# Patient Record
Sex: Female | Born: 2000 | Hispanic: No | Marital: Single | Smoking: Never smoker
Health system: Southern US, Community
[De-identification: ages and names within clinical notes are randomized; demographics above are authoritative.]

## PROBLEM LIST (undated history)

## (undated) DIAGNOSIS — F4010 Social phobia, unspecified: Secondary | ICD-10-CM

## (undated) DIAGNOSIS — F419 Anxiety disorder, unspecified: Secondary | ICD-10-CM

## (undated) DIAGNOSIS — F32A Depression, unspecified: Secondary | ICD-10-CM

## (undated) DIAGNOSIS — F429 Obsessive-compulsive disorder, unspecified: Secondary | ICD-10-CM

## (undated) DIAGNOSIS — F329 Major depressive disorder, single episode, unspecified: Secondary | ICD-10-CM

## (undated) HISTORY — DX: Obsessive-compulsive disorder, unspecified: F42.9

## (undated) HISTORY — DX: Anxiety disorder, unspecified: F41.9

## (undated) HISTORY — DX: Depression, unspecified: F32.A

## (undated) HISTORY — DX: Social phobia, unspecified: F40.10

## (undated) HISTORY — DX: Major depressive disorder, single episode, unspecified: F32.9

---
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
## 2020-01-13 ENCOUNTER — Telehealth: Payer: No Typology Code available for payment source | Admitting: Emergency Medicine

## 2020-01-13 DIAGNOSIS — R112 Nausea with vomiting, unspecified: Secondary | ICD-10-CM

## 2020-01-13 NOTE — Progress Notes (Signed)
Based on what you shared with me, I feel your condition warrants further evaluation and I recommend that you be seen for a face to face office visit.   NOTE: If you entered your credit card information for this eVisit, you will not be charged. You may see a "hold" on your card for the $35 but that hold will drop off and you will not have a charge processed.   If you are having a true medical emergency please call 911.      For an urgent face to face visit, [facility] has five urgent care centers for your convenience:      NEW:  [facility] Get Driving Directions [phone] [address] [place], [place] [address] . 10 am - 6pm Monday - Friday    [facility] ([facility]) Get Driving Directions [phone] [address] [place], [place] [address] . 10 am to 8 pm Monday-Friday . 12 pm to 8 pm Saturday-Sunday     [facility] Get Driving Directions [phone] [address], [address] [place], [place] [address] . 8 am to 8 pm Monday-Friday . 9 am to 6 pm Saturday . 11 am to 6 pm Sunday     [facility] [place] Get Driving Directions  [phone] [address] [place], [place] [address] . 8 am to 8 pm Monday-Friday . 8 am to 4 pm Saturday-Sunday   [facility] Get Driving Directions [phone] [address] [place],  [address] . 12 pm to 6 pm Monday-Friday      Your e-visit answers were reviewed by a board certified advanced clinical practitioner to complete your personal care plan.  Thank you for using e-Visits.    Greater than 5 but less than 10 minutes spent researching, coordinating, and implementing care for this patient today  

## 2020-05-18 ENCOUNTER — Encounter (INDEPENDENT_AMBULATORY_CARE_PROVIDER_SITE_OTHER): Payer: Self-pay

## 2020-05-19 ENCOUNTER — Other Ambulatory Visit (INDEPENDENT_AMBULATORY_CARE_PROVIDER_SITE_OTHER): Payer: Self-pay | Admitting: *Deleted

## 2020-05-19 DIAGNOSIS — Z789 Other specified health status: Secondary | ICD-10-CM

## 2020-07-20 ENCOUNTER — Ambulatory Visit: Payer: 59 | Admitting: Endocrinology

## 2020-08-31 ENCOUNTER — Ambulatory Visit (INDEPENDENT_AMBULATORY_CARE_PROVIDER_SITE_OTHER): Payer: 59 | Admitting: Endocrinology

## 2020-08-31 ENCOUNTER — Other Ambulatory Visit: Payer: Self-pay

## 2020-08-31 VITALS — BP 122/80 | HR 100 | Ht 67.75 in | Wt 160.8 lb

## 2020-08-31 DIAGNOSIS — G43909 Migraine, unspecified, not intractable, without status migrainosus: Secondary | ICD-10-CM | POA: Insufficient documentation

## 2020-08-31 DIAGNOSIS — G43809 Other migraine, not intractable, without status migrainosus: Secondary | ICD-10-CM

## 2020-08-31 DIAGNOSIS — Z789 Other specified health status: Secondary | ICD-10-CM | POA: Diagnosis not present

## 2020-08-31 DIAGNOSIS — F419 Anxiety disorder, unspecified: Secondary | ICD-10-CM | POA: Diagnosis not present

## 2020-08-31 MED ORDER — TESTOSTERONE CYPIONATE 100 MG/ML IM SOLN: 100.0000 mg | INTRAMUSCULAR | 0 refills | Status: DC

## 2020-08-31 NOTE — Progress Notes (Signed)
Subjective:    Patient ID: Ann Gardner, adult    DOB: [birth year], 19 y.o.   MRN: [id number]  HPI Hx is mostly from pt's mother, as pt says very little.  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  He had puberty at the age of 22.  He had breast reduction in early 2021.  He has been going to counseling for this since 2011.  He has no children.  He has no h/o DVT, dyslipidemia, liver disease, kidney disease, sleep apnea, heart disease, CVA, diabetes, smoking, HTN, epilepsy, gallbladder disease, blood disorders, osteoporosis, or cancer.  He has been on testosterone injections since 2020, but he ran out 2 mos ago.  Menses have returned.  Pt says testosterone injections have caused increased terminal hair, but no other effect.  No change in chronic anxiety.  Past Medical History:  Diagnosis Date  . Anxiety   . Depression   . OCD (obsessive compulsive disorder)   . Social anxiety disorder     No past surgical history on file.  Social History   Socioeconomic History  . Marital status: Single    Spouse name: Not on file  . Number of children: Not on file  . Years of education: Not on file  . Highest education level: Not on file  Occupational History  . Not on file  Tobacco Use  . Smoking status: Passive Smoke Exposure - Never Smoker  . Smokeless tobacco: Never Used  . Tobacco comment: smokes outside only  Substance and Sexual Activity  . Alcohol use: Not on file  . Drug use: Not on file  . Sexual activity: Not on file  Other Topics Concern  . Not on file  Social History Narrative   Lives with mom, grandma, and step-dad.    He is a Engineer, agricultural.   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    He enjoys playing video games, listening to music, and sleeping.    Social Determinants of Health   Financial Resource Strain: Not on file  Food Insecurity: Not on file  Transportation Needs: Not on file  Physical Activity: Not on file  Stress: Not on file  Social  Connections: Not on file  Intimate Partner Violence: Not on file    Current Outpatient Medications on File Prior to Visit  Medication Sig Dispense Refill  . buPROPion (WELLBUTRIN XL) 150 MG 24 hr tablet Take 225 mg by mouth daily.     . Tuberculin-Allergy Syringes (B-D TB SYRINGE 1CC/22GX1") 22G X 1" 1 ML MISC Use with testosterone. 0.5 ml = 5 units 10 each 4  . amoxicillin-clavulanate (AUGMENTIN) 875-125 MG tablet amoxicillin 875 mg-potassium clavulanate 125 mg tablet  TAKE 1 TABLET BY MOUTH TWICE DAILY    . benzonatate (TESSALON PERLES) 100 MG capsule Take 1 capsule (100 mg total) by mouth 3 (three) times daily as needed. 20 capsule 0  . clindamycin-benzoyl peroxide (BENZACLIN) gel Apply topically 2 (two) times daily. (Patient not taking: Reported on 11/12/2019) 25 g 0  . dimenhyDRINATE (DRAMAMINE) 50 MG tablet Take 1 tablet (50 mg total) by mouth every 8 (eight) hours as needed. (Patient not taking: Reported on 11/12/2019) 30 tablet 0  . famotidine (PEPCID) 20 MG tablet Take 1 tablet (20 mg total) by mouth 2 (two) times daily. 60 tablet 2  . fluticasone (FLONASE) 50 MCG/ACT nasal spray Place 2 sprays into both nostrils daily. 16 g 6  . lansoprazole (PREVACID) 30 MG capsule Take 1  capsule (30 mg total) by mouth daily at 12 noon. (Patient not taking: Reported on 11/12/2019) 14 capsule 0  . ondansetron (ZOFRAN) 4 MG tablet Take 1 tablet (4 mg total) by mouth every 8 (eight) hours as needed for nausea or vomiting. 20 tablet 0   No current facility-administered medications on file prior to visit.    Allergies  Allergen Reactions  . Red Dye   . Sulfa Antibiotics     Family History  Problem Relation Age of Onset  . Osteoporosis Maternal Grandmother   . Hypertension Maternal Grandmother   . Lung cancer Maternal Grandfather   . Hypertension Maternal Grandfather     BP 122/80   Pulse 100   Ht 5' 7.75" (1.721 m)   Wt 160 lb 12.8 oz (72.9 kg)   SpO2 99%   BMI 24.63 kg/m   Review of  Systems Denies violent behavior, acne, weight change, sob, and nausea.       Objective:   Physical Exam VS: see vs page GEN: no distress HEAD: head: no deformity eyes: no periorbital swelling, no proptosis external nose and ears are normal NECK: supple, thyroid is not enlarged CHEST WALL: no deformity LUNGS: clear to auscultation BREASTS:  No breasts are present.  CV: reg rate and rhythm, no murmur GENITALIA:  Declined (but pt reports normal female appearance) MUSCULOSKELETAL: muscle bulk and strength are grossly normal.  no joint swelling is seen  gait is normal and steady EXTEMITIES: no leg edema NEURO:  cn 2-12 grossly intact.  sensation is intact to touch on all 4's. SKIN:  Mild acne on the back.  Moderate terminal hair on the extremities, but none on the face NODES:  None palpable at the neck PSYCH: alert, well-oriented.  Does not appear anxious nor depressed.     Testosterone=357     Assessment & Plan:  Transgender state, new to me Anxiety: check TFT with next labs  Patient Instructions  I have sent a prescription to your pharmacy, to resume the testosterone injections.   I would be happy to add another pill called "elagolix."  Just let me know.   Here is some information about the [facility] transgender service.  Please let me know if you would like a referral there Please do the blood tests in 2 months.  Please come back for a follow-up appointment in 6 months.

## 2020-08-31 NOTE — Patient Instructions (Signed)
I have sent a prescription to your pharmacy, to resume the testosterone injections.   I would be happy to add another pill called "elagolix."  Just let me know.   Here is some information about the [facility] transgender service.  Please let me know if you would like a referral there Please do the blood tests in 2 months.  Please come back for a follow-up appointment in 6 months.

## 2020-09-05 IMAGING — DX DG FOOT COMPLETE 3+V*R*
3 series · 3 of 3 positions shown · non-contrast
Comparison: 09/12/2016

CLINICAL DATA: Patient stepped on sewing needle 2 weeks ago.

EXAM:
RIGHT FOOT COMPLETE - 3+ VIEW

[foot ap]
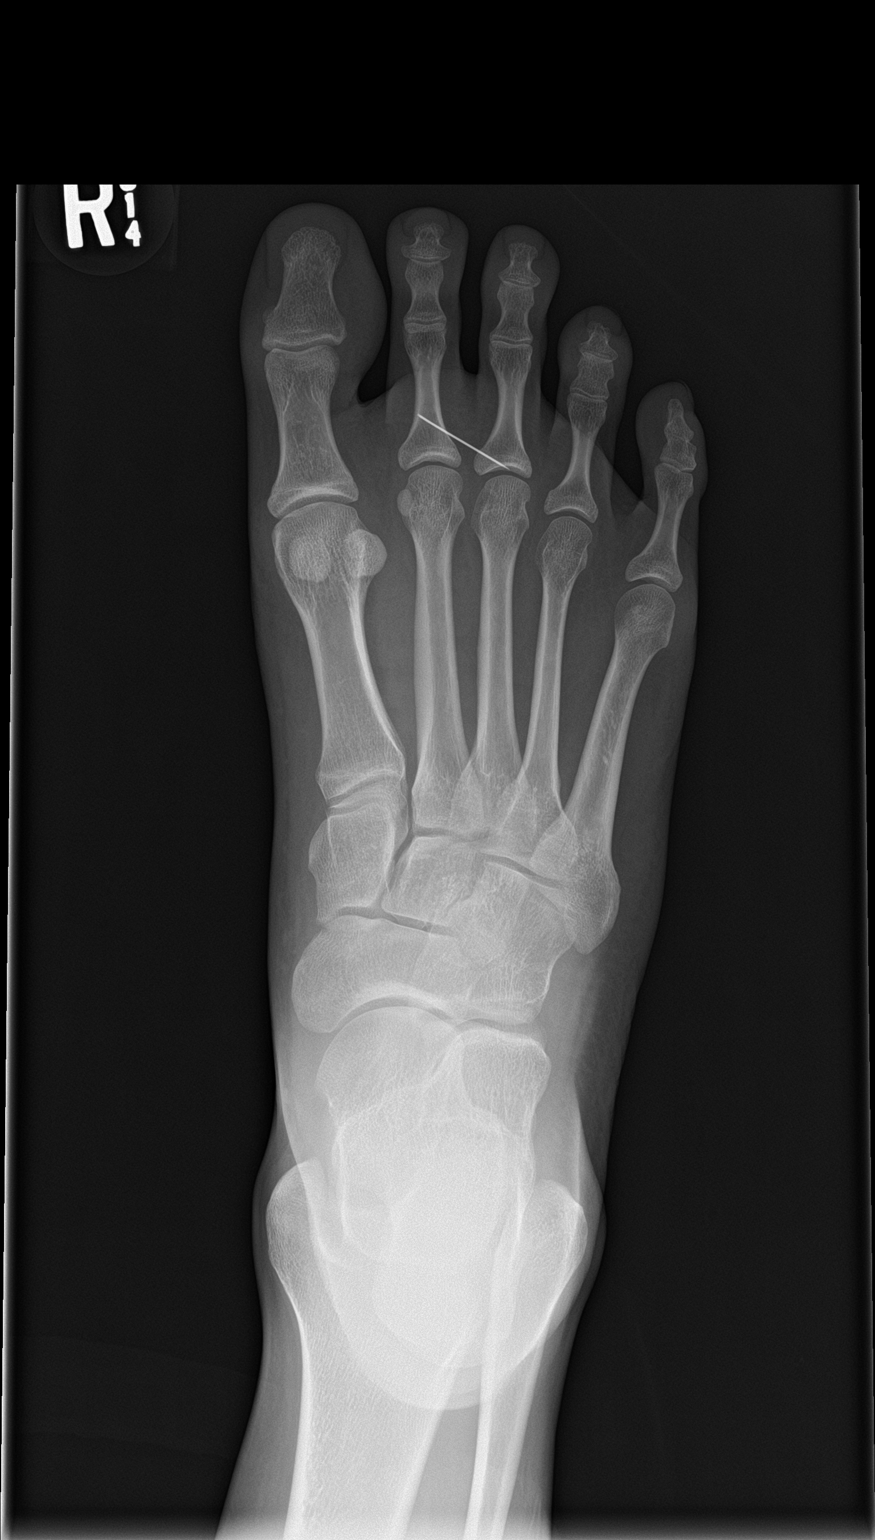

[foot obl]
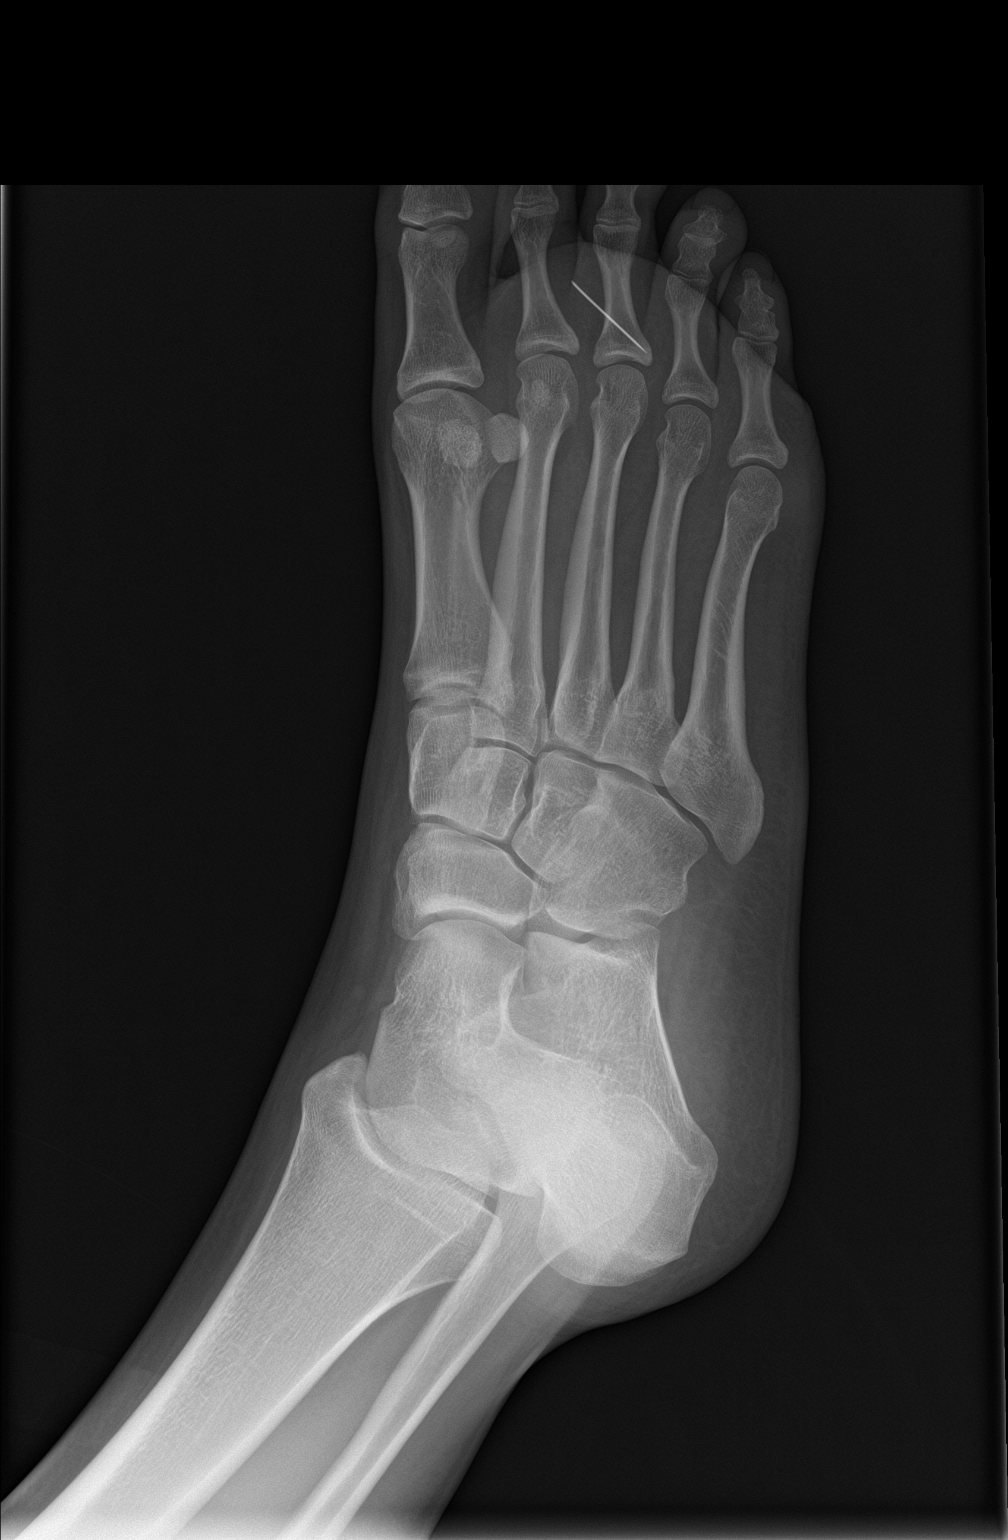

[foot lat]
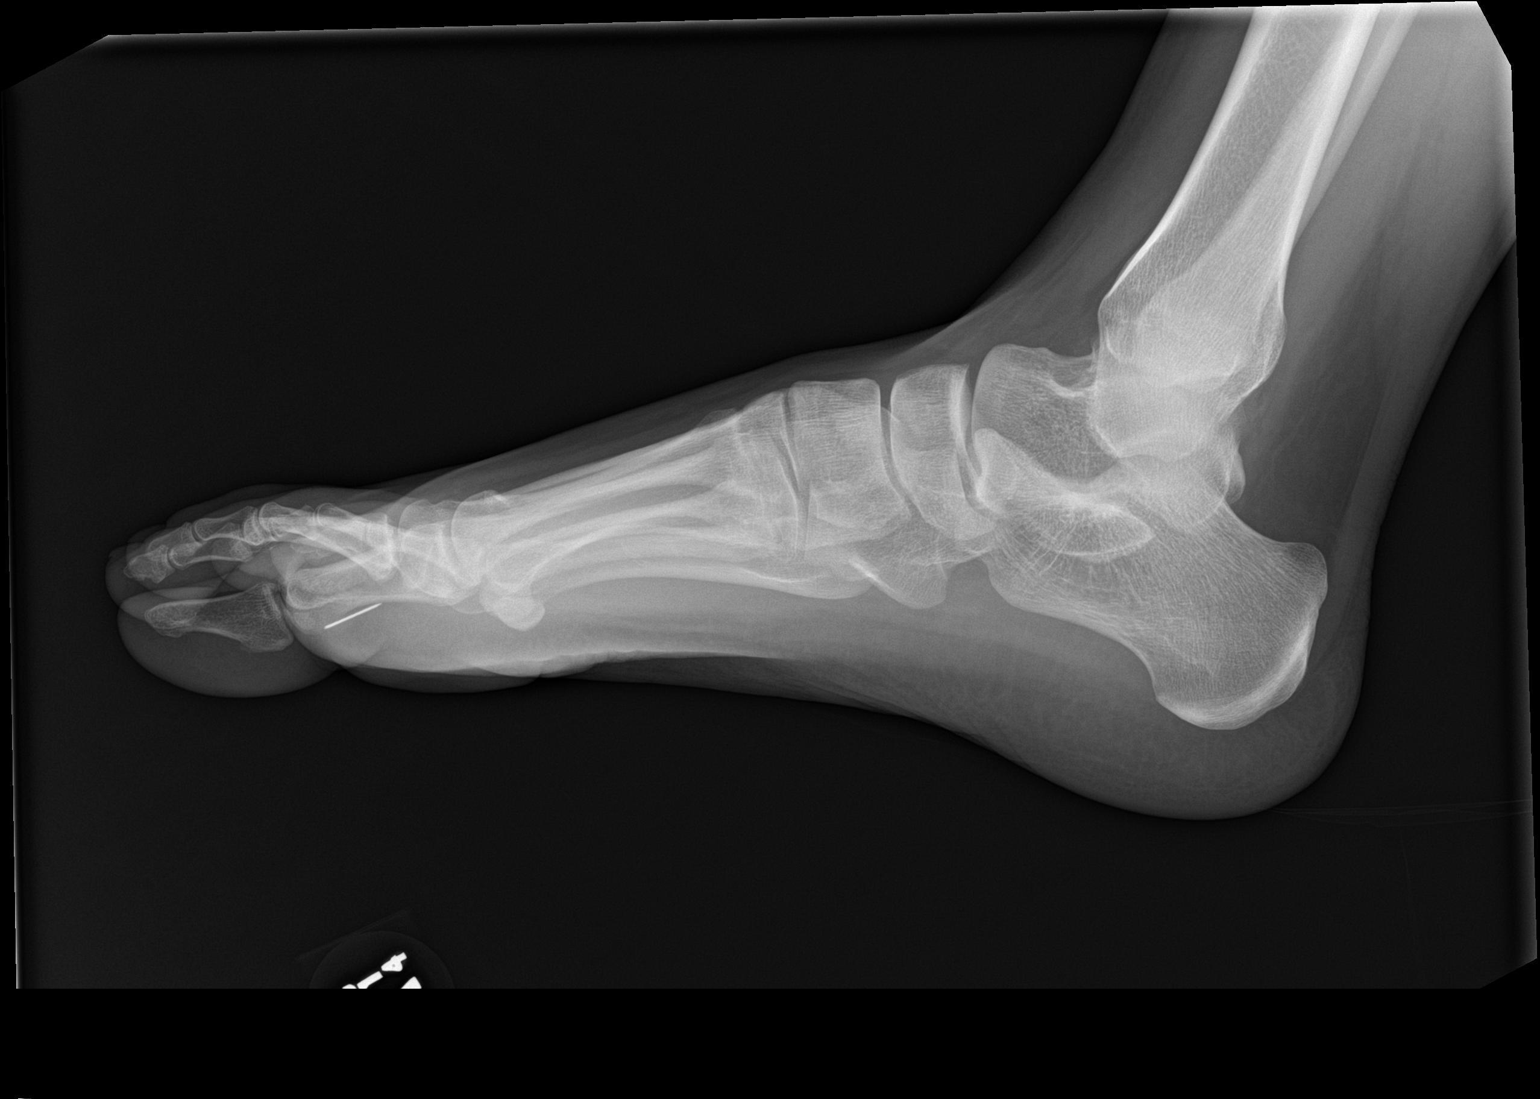

[3 of 3 positions shown; findings below may reference images not displayed]

FINDINGS: There is no evidence of fracture or dislocation. There is no
evidence of arthropathy or other focal bone abnormality. Linear
metallic foreign body, consistent with sewing needle is located
within the palmar soft tissues overlying the proximal second and
third digits, tip at the level of the third metatarsophalangeal
joint.
IMPRESSION: Linear metallic foreign body, consistent with sewing needle is
located within the palmar soft tissues overlying the proximal second
and third digits, tip at the level of the third metatarsophalangeal
joint.

## 2020-10-16 ENCOUNTER — Encounter: Payer: Self-pay | Admitting: Endocrinology

## 2020-10-22 ENCOUNTER — Encounter: Payer: Self-pay | Admitting: Endocrinology

## 2020-11-03 ENCOUNTER — Other Ambulatory Visit: Payer: 59

## 2020-11-23 ENCOUNTER — Encounter: Payer: Self-pay | Admitting: Endocrinology

## 2020-11-23 ENCOUNTER — Other Ambulatory Visit (INDEPENDENT_AMBULATORY_CARE_PROVIDER_SITE_OTHER): Payer: Self-pay

## 2020-11-23 ENCOUNTER — Other Ambulatory Visit: Payer: Self-pay

## 2020-11-23 DIAGNOSIS — Z789 Other specified health status: Secondary | ICD-10-CM

## 2020-11-23 LAB — CBC WITH DIFFERENTIAL/PLATELET
Basophils Absolute: 0.1 10*3/uL (ref 0.0–0.1)
Basophils Relative: 0.7 % (ref 0.0–3.0)
Eosinophils Absolute: 0.1 10*3/uL (ref 0.0–0.7)
Eosinophils Relative: 1.1 % (ref 0.0–5.0)
HCT: 43.7 % (ref 36.0–46.0)
Hemoglobin: 14.8 g/dL (ref 12.0–15.0)
Lymphocytes Relative: 20.4 % (ref 12.0–46.0)
Lymphs Abs: 1.7 10*3/uL (ref 0.7–4.0)
MCHC: 34 g/dL (ref 30.0–36.0)
MCV: 88.2 fl (ref 78.0–100.0)
Monocytes Absolute: 0.6 10*3/uL (ref 0.1–1.0)
Monocytes Relative: 7.1 % (ref 3.0–12.0)
Neutro Abs: 5.8 10*3/uL (ref 1.4–7.7)
Neutrophils Relative %: 70.7 % (ref 43.0–77.0)
Platelets: 183 10*3/uL (ref 150.0–400.0)
RBC: 4.95 Mil/uL (ref 3.87–5.11)
RDW: 13.7 % (ref 11.5–14.6)
WBC: 8.2 10*3/uL (ref 4.5–10.5)

## 2020-11-23 LAB — TSH: TSH: 1.51 u[IU]/mL (ref 0.35–5.50)

## 2020-11-23 LAB — T4, FREE: Free T4: 0.82 ng/dL (ref 0.60–1.60)

## 2020-11-24 LAB — TESTOSTERONE,FREE AND TOTAL
Testosterone, Free: 18.4 pg/mL — ABNORMAL HIGH (ref 0.0–4.2)
Testosterone: 783 ng/dL — ABNORMAL HIGH (ref 13–71)

## 2020-11-28 LAB — ESTRADIOL, FREE
Estradiol, Free: 3.97 pg/mL
Estradiol: 189 pg/mL

## 2020-12-15 ENCOUNTER — Encounter: Payer: Self-pay | Admitting: Endocrinology

## 2021-01-25 ENCOUNTER — Other Ambulatory Visit: Payer: Self-pay | Admitting: Endocrinology

## 2021-01-25 ENCOUNTER — Encounter: Payer: Self-pay | Admitting: Endocrinology

## 2021-01-25 MED ORDER — TESTOSTERONE CYPIONATE 100 MG/ML IM SOLN: 80.0000 mg | INTRAMUSCULAR | 0 refills | Status: DC

## 2021-02-16 ENCOUNTER — Telehealth: Payer: Self-pay | Admitting: Physician Assistant

## 2021-02-16 DIAGNOSIS — R140 Abdominal distension (gaseous): Secondary | ICD-10-CM

## 2021-02-16 NOTE — Progress Notes (Signed)
Based on what you shared with me, I feel your condition warrants further evaluation and I recommend that you be seen in a face to face office visit. Inability to pass gas with abdominal pain and bloating can be a sign of something more serious.  You need to have a physical exam and possibly imaging to determine the root of the problem.   NOTE: If you entered your credit card information for this eVisit, you will not be charged. You may see a "hold" on your card for the $35 but that hold will drop off and you will not have a charge processed.   If you are having a true medical emergency please call 911.      For an urgent face to face visit, [facility] has six urgent care centers for your convenience:     [facility] Directions [phone] [address] [place], [place] [address] . 8 am - 4 pm Monday - Friday    [facility]) Get Driving Directions [phone] [address] [place], [place] [address] . 8 am to 8 pm Monday-Friday . 10 am to 6 pm [facility]) Get Driving Directions [phone]  [address] [place],  [place]  [address] . 8 am to 8 pm Monday-Friday . 8 am to 4 pm [facility] at [facility] Get Driving Directions [phone] [address] [place], [place] [address] . 8 am to 8 pm Monday-Friday . 8 am to 4 pm [facility] at [facility] Get Driving Directions  [phone] [address] [place], [place] [address] . 8 am to 8 pm Monday-Friday . 8 am to 4 pm [facility] at [facility] Directions [phone] [address] [place], [place] [address] . 8 am to 8 pm Monday-Friday . 8 am to 4 pm Saturday-Sunday     Your MyChart E-visit  questionnaire answers were reviewed by a board certified advanced clinical practitioner to complete your personal care plan based on your specific symptoms.  Thank you for using e-Visits.    Greater than 5 minutes, yet less than 10 minutes of time have been spent researching, coordinating, and implementing care for this patient today

## 2021-02-25 ENCOUNTER — Ambulatory Visit: Payer: 59 | Admitting: Endocrinology

## 2021-03-16 ENCOUNTER — Telehealth: Payer: Self-pay | Admitting: Physician Assistant

## 2021-03-16 DIAGNOSIS — J069 Acute upper respiratory infection, unspecified: Secondary | ICD-10-CM

## 2021-03-17 MED ORDER — FLUTICASONE PROPIONATE 50 MCG/ACT NA SUSP: 2.0000 | Freq: Every day | NASAL | 6 refills | Status: DC

## 2021-03-17 MED ORDER — BENZONATATE 100 MG PO CAPS
100.0000 mg | ORAL_CAPSULE | Freq: Three times a day (TID) | ORAL | 0 refills | Status: DC | PRN
Start: 2021-03-17 — End: 2021-05-27

## 2021-03-17 NOTE — Progress Notes (Signed)
We are sorry you are not feeling well.  Here is how we plan to help!  Based on what you have shared with me, it looks like you may have a viral upper respiratory infection.  Upper respiratory infections are caused by a large number of viruses; however, rhinovirus is the most common cause.   Symptoms vary from person to person, with common symptoms including sore throat, cough, fatigue or lack of energy and feeling of general discomfort.  A low-grade fever of up to 100.4 may present, but is often uncommon.  Symptoms vary however, and are closely related to a person's age or underlying illnesses.  The most common symptoms associated with an upper respiratory infection are nasal discharge or congestion, cough, sneezing, headache and pressure in the ears and face.  These symptoms usually persist for about 3 to 10 days, but can last up to 2 weeks.  It is important to know that upper respiratory infections do not cause serious illness or complications in most cases.    Upper respiratory infections can be transmitted from person to person, with the most common method of transmission being a person's hands.  The virus is able to live on the skin and can infect other persons for up to 2 hours after direct contact.  Also, these can be transmitted when someone coughs or sneezes; thus, it is important to cover the mouth to reduce this risk.  To keep the spread of the illness at bay, good hand hygiene is very important.  This is an infection that is most likely caused by a virus. There are no specific treatments other than to help you with the symptoms until the infection runs its course.  We are sorry you are not feeling well.  Here is how we plan to help!   For nasal congestion, you may use an oral decongestants such as Mucinex D or if you have glaucoma or high blood pressure use plain Mucinex.  Saline nasal spray or nasal drops can help and can safely be used as often as needed for congestion.  For your congestion,  I have prescribed Fluticasone nasal spray one spray in each nostril twice a day  If you do not have a history of heart disease, hypertension, diabetes or thyroid disease, prostate/bladder issues or glaucoma, you may also use Sudafed to treat nasal congestion.  It is highly recommended that you consult with a pharmacist or your primary care physician to ensure this medication is safe for you to take.     If you have a cough, you may use cough suppressants such as Delsym and Robitussin.  If you have glaucoma or high blood pressure, you can also use Coricidin HBP.   For cough I have prescribed for you A prescription cough medication called Tessalon Perles 100 mg. You may take 1-2 capsules every 8 hours as needed for cough  If you have a sore or scratchy throat, use a saltwater gargle-  to  teaspoon of salt dissolved in a 4-ounce to 8-ounce glass of warm water.  Gargle the solution for approximately 15-30 seconds and then spit.  It is important not to swallow the solution.  You can also use throat lozenges/cough drops and Chloraseptic spray to help with throat pain or discomfort.  Warm or cold liquids can also be helpful in relieving throat pain.  For headache, pain or general discomfort, you can use Ibuprofen or Tylenol as directed.   Some authorities believe that zinc sprays or the use of   Echinacea may shorten the course of your symptoms.   HOME CARE . Only take medications as instructed by your medical team. . Be sure to drink plenty of fluids. Water is fine as well as fruit juices, sodas and electrolyte beverages. You may want to stay away from caffeine or alcohol. If you are nauseated, try taking small sips of liquids. How do you know if you are getting enough fluid? Your urine should be a pale yellow or almost colorless. . Get rest. . Taking a steamy shower or using a humidifier may help nasal congestion and ease sore throat pain. You can place a towel over your head and breathe in the steam  from hot water coming from a faucet. . Using a saline nasal spray works much the same way. . Cough drops, hard candies and sore throat lozenges may ease your cough. . Avoid close contacts especially the very young and the elderly . Cover your mouth if you cough or sneeze . Always remember to wash your hands.   GET HELP RIGHT AWAY IF: . You develop worsening fever. . If your symptoms do not improve within 10 days . You develop yellow or green discharge from your nose over 3 days. . You have coughing fits . You develop a severe head ache or visual changes. . You develop shortness of breath, difficulty breathing or start having chest pain . Your symptoms persist after you have completed your treatment plan  MAKE SURE YOU   Understand these instructions.  Will watch your condition.  Will get help right away if you are not doing well or get worse.  Your e-visit answers were reviewed by a board certified advanced clinical practitioner to complete your personal care plan. Depending upon the condition, your plan could have included both over the counter or prescription medications. Please review your pharmacy choice. If there is a problem, you may call our nursing hot line at and have the prescription routed to another pharmacy. Your safety is important to us. If you have drug allergies check your prescription carefully.   You can use MyChart to ask questions about today's visit, request a non-urgent call back, or ask for a work or school excuse for 24 hours related to this e-Visit. If it has been greater than 24 hours you will need to follow up with your provider, or enter a new e-Visit to address those concerns. You will get an e-mail in the next two days asking about your experience.  I hope that your e-visit has been valuable and will speed your recovery. Thank you for using e-visits.  I provided 5 minutes of non face-to-face time during this encounter for chart review and documentation.    

## 2021-05-27 ENCOUNTER — Other Ambulatory Visit: Payer: Self-pay

## 2021-05-27 ENCOUNTER — Ambulatory Visit (INDEPENDENT_AMBULATORY_CARE_PROVIDER_SITE_OTHER): Payer: Self-pay | Admitting: Endocrinology

## 2021-05-27 VITALS — BP 120/70 | HR 79 | Ht 67.5 in | Wt 181.2 lb

## 2021-05-27 DIAGNOSIS — Z789 Other specified health status: Secondary | ICD-10-CM

## 2021-05-27 LAB — CBC WITH DIFFERENTIAL/PLATELET
Basophils Absolute: 0 10*3/uL (ref 0.0–0.1)
Basophils Relative: 1 % (ref 0.0–3.0)
Eosinophils Absolute: 0.1 10*3/uL (ref 0.0–0.7)
Eosinophils Relative: 1.5 % (ref 0.0–5.0)
HCT: 45.8 % (ref 36.0–46.0)
Hemoglobin: 15.5 g/dL — ABNORMAL HIGH (ref 12.0–15.0)
Lymphocytes Relative: 32.5 % (ref 12.0–46.0)
Lymphs Abs: 1.7 10*3/uL (ref 0.7–4.0)
MCHC: 33.9 g/dL (ref 30.0–36.0)
MCV: 91 fl (ref 78.0–100.0)
Monocytes Absolute: 0.4 10*3/uL (ref 0.1–1.0)
Monocytes Relative: 7.6 % (ref 3.0–12.0)
Neutro Abs: 3 10*3/uL (ref 1.4–7.7)
Neutrophils Relative %: 57.4 % (ref 43.0–77.0)
Platelets: 161 10*3/uL (ref 150.0–400.0)
RBC: 5.04 Mil/uL (ref 3.87–5.11)
RDW: 14 % (ref 11.5–14.6)
WBC: 5.1 10*3/uL (ref 4.5–10.5)

## 2021-05-27 NOTE — Progress Notes (Signed)
   Subjective:    Patient ID: Ann Gardner, adult    DOB: 20-May-2001, 20 y.o.   MRN: [id number]  HPI Pt returns for f/u of transgender state (F to M): Surgery:  breast reduction in 2021 Medication: depo-testosterone since 2020 Counseling: since 2011 Other: Ins declines elagolix.  SDOH: Hx is mostly from pt's mother, as pt says very little. Interval hx: Last shot was 2 weeks ago.  He has no menses.   Past Medical History:  Diagnosis Date   Anxiety    Depression    OCD (obsessive compulsive disorder)    Social anxiety disorder     No past surgical history on file.  Social History   Socioeconomic History   Marital status: Single    Spouse name: Not on file   Number of children: Not on file   Years of education: Not on file   Highest education level: Not on file  Occupational History   Not on file  Tobacco Use   Smoking status: Passive Smoke Exposure - Never Smoker   Smokeless tobacco: Never   Tobacco comments:    smokes outside only  Substance and Sexual Activity   Alcohol use: Not on file   Drug use: Not on file   Sexual activity: Not on file  Other Topics Concern   Not on file  Social History Narrative   Lives with mom, grandma, and step-dad.    He is a Engineer, agricultural.   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    He enjoys playing video games, listening to music, and sleeping.    Social Determinants of Health   Financial Resource Strain: Not on file  Food Insecurity: Not on file  Transportation Needs: Not on file  Physical Activity: Not on file  Stress: Not on file  Social Connections: Not on file  Intimate Partner Violence: Not on file    Current Outpatient Medications on File Prior to Visit  Medication Sig Dispense Refill   buPROPion (WELLBUTRIN XL) 150 MG 24 hr tablet Take 225 mg by mouth daily.      fluticasone (FLONASE) 50 MCG/ACT nasal spray Place 2 sprays into both nostrils daily. 16 g 6   testosterone cypionate (DEPO-TESTOSTERONE) 100 MG/ML  injection Inject 0.8 mLs (80 mg total) into the muscle every 14 (fourteen) days. And syringes 1/week 10 mL 0   Tuberculin-Allergy Syringes (B-D TB SYRINGE 1CC/22GX1") 22G X 1" 1 ML MISC Use with testosterone. 0.5 ml = 5 units 10 each 4   No current facility-administered medications on file prior to visit.    Allergies  Allergen Reactions   Red Dye    Sulfa Antibiotics     Family History  Problem Relation Age of Onset   Osteoporosis Maternal Grandmother    Hypertension Maternal Grandmother    Lung cancer Maternal Grandfather    Hypertension Maternal Grandfather     BP 120/70 (BP Location: Right Arm, Patient Position: Sitting, Cuff Size: Normal)   Pulse 79   Ht 5' 7.5" (1.715 m)   Wt 181 lb 3.2 oz (82.2 kg)   SpO2 98%   BMI 27.96 kg/m    Review of Systems     Objective:   Physical Exam EXT: no leg edema  Lab Results  Component Value Date   TESTOSTERONE 368 (H) 05/27/2021      Assessment & Plan:  Transgender state: I advised him to change testosterone to weekly, but he declines

## 2021-05-27 NOTE — Patient Instructions (Addendum)
Blood tests are requested for you today.  We'll let you know about the results.  Please let me know if you would like a referral for surgery.   Please come back for a follow-up appointment in 6 months.

## 2021-06-02 LAB — TESTOSTERONE,FREE AND TOTAL
Testosterone, Free: 9.5 pg/mL — ABNORMAL HIGH (ref 0.0–4.2)
Testosterone: 368 ng/dL — ABNORMAL HIGH (ref 13–71)

## 2021-06-05 LAB — ESTRADIOL, FREE
Estradiol, Free: 0.75 pg/mL
Estradiol: 34 pg/mL

## 2021-06-06 ENCOUNTER — Encounter: Payer: Self-pay | Admitting: Endocrinology

## 2021-07-02 ENCOUNTER — Other Ambulatory Visit: Payer: Self-pay | Admitting: Endocrinology

## 2021-07-02 MED ORDER — TESTOSTERONE CYPIONATE 100 MG/ML IM SOLN: 80.0000 mg | INTRAMUSCULAR | 0 refills | Status: DC

## 2021-07-22 ENCOUNTER — Other Ambulatory Visit (HOSPITAL_COMMUNITY): Payer: Self-pay

## 2021-07-22 ENCOUNTER — Telehealth: Payer: Self-pay | Admitting: Pharmacy Technician

## 2021-07-22 NOTE — Telephone Encounter (Signed)
Patient Advocate Encounter   Received notification from CoverMyMeds that prior authorization for Testosterone is required.   PA submitted on 07/22/21 Key [id number] Status is pending    [facility] will continue to follow:   Sherilyn Dacosta, CPhT Patient Advocate [facility] Phone: [phone] Fax:  [phone]

## 2021-07-23 ENCOUNTER — Other Ambulatory Visit (HOSPITAL_COMMUNITY): Payer: Self-pay

## 2021-07-23 NOTE — Telephone Encounter (Signed)
[facility] Patient Advocate Encounter  Prior Authorization for Testosterone has been approved.    PA# PA Case ID: [id number]  Effective dates: 07/22/21 through 07/22/22  Sherilyn Dacosta, CPhT Patient Advocate [facility] Phone: [phone] Fax:  [phone]

## 2021-07-28 ENCOUNTER — Telehealth: Payer: Self-pay | Admitting: Physician Assistant

## 2021-07-28 DIAGNOSIS — J029 Acute pharyngitis, unspecified: Secondary | ICD-10-CM

## 2021-07-28 MED ORDER — LIDOCAINE VISCOUS HCL 2 % MT SOLN: OROMUCOSAL | 0 refills | Status: AC

## 2021-07-28 MED ORDER — FLUTICASONE PROPIONATE 50 MCG/ACT NA SUSP: 2.0000 | Freq: Every day | NASAL | 0 refills | Status: DC

## 2021-07-28 NOTE — Progress Notes (Signed)
E-Visit for Sore Throat  We are sorry that you are not feeling well.  Here is how we plan to help!  Your symptoms indicate a likely viral infection (Pharyngitis).   Pharyngitis is inflammation in the back of the throat which can cause a sore throat, scratchiness and sometimes difficulty swallowing.   Pharyngitis is typically caused by a respiratory virus and will just run its course.  Please keep in mind that your symptoms could last up to 10 days.  For throat pain, we recommend over the counter oral pain relief medications such as acetaminophen or aspirin, or anti-inflammatory medications such as ibuprofen or naproxen sodium.  Topical treatments such as oral throat lozenges or sprays may be used as needed.  Avoid close contact with loved ones, especially the very young and elderly.  Remember to wash your hands thoroughly throughout the day as this is the number one way to prevent the spread of infection and wipe down door knobs and counters with disinfectant.  I will prescribe fluticasone nasal spray for the nasal congestion and viscous lidocaine for the sore throat.   After careful review of your answers, I would not recommend and antibiotic for your condition.  Antibiotics should not be used to treat conditions that we suspect are caused by viruses like the virus that causes the common cold or flu. However, some people can have Strep with atypical symptoms. You may need formal testing in clinic or office to confirm if your symptoms continue or worsen.  Providers prescribe antibiotics to treat infections caused by bacteria. Antibiotics are very powerful in treating bacterial infections when they are used properly.  To maintain their effectiveness, they should be used only when necessary.  Overuse of antibiotics has resulted in the development of super bugs that are resistant to treatment!    Home Care: Only take medications as instructed by your medical team. Do not drink alcohol while taking these  medications. A steam or ultrasonic humidifier can help congestion.  You can place a towel over your head and breathe in the steam from hot water coming from a faucet. Avoid close contacts especially the very young and the elderly. Cover your mouth when you cough or sneeze. Always remember to wash your hands.  Get Help Right Away If: You develop worsening fever or throat pain. You develop a severe head ache or visual changes. Your symptoms persist after you have completed your treatment plan.  Make sure you Understand these instructions. Will watch your condition. Will get help right away if you are not doing well or get worse.   Thank you for choosing an e-visit.  Your e-visit answers were reviewed by a board certified advanced clinical practitioner to complete your personal care plan. Depending upon the condition, your plan could have included both over the counter or prescription medications.  Please review your pharmacy choice. Make sure the pharmacy is open so you can pick up prescription now. If there is a problem, you may contact your provider through [employer] and have the prescription routed to another pharmacy.  Your safety is important to [place]. If you have drug allergies check your prescription carefully.   For the next 24 hours you can use MyChart to ask questions about today's visit, request a non-urgent call back, or ask for a work or school excuse. You will get an email in the next two days asking about your experience. I hope that your e-visit has been valuable and will speed your recovery.  I  provided 5 minutes of non face-to-face time during this encounter for chart review and documentation.

## 2021-07-29 ENCOUNTER — Other Ambulatory Visit (HOSPITAL_COMMUNITY): Payer: Self-pay

## 2021-10-12 ENCOUNTER — Telehealth: Payer: Self-pay | Admitting: Family Medicine

## 2021-10-12 DIAGNOSIS — Z91199 Patient's noncompliance with other medical treatment and regimen due to unspecified reason: Secondary | ICD-10-CM

## 2021-10-12 NOTE — Progress Notes (Signed)
Never responded back to clarifying questions.  [facility] visit  Message sent

## 2021-10-15 ENCOUNTER — Telehealth: Payer: Self-pay | Admitting: Physician Assistant

## 2021-10-15 DIAGNOSIS — J069 Acute upper respiratory infection, unspecified: Secondary | ICD-10-CM

## 2021-10-15 MED ORDER — NAPROXEN 500 MG PO TABS: 500.0000 mg | ORAL_TABLET | Freq: Two times a day (BID) | ORAL | 0 refills | Status: DC

## 2021-10-15 MED ORDER — FLUTICASONE PROPIONATE 50 MCG/ACT NA SUSP: 2.0000 | Freq: Every day | NASAL | 0 refills | Status: DC

## 2021-10-15 MED ORDER — BENZONATATE 100 MG PO CAPS: 100.0000 mg | ORAL_CAPSULE | Freq: Three times a day (TID) | ORAL | 0 refills | Status: DC | PRN

## 2021-10-15 NOTE — Progress Notes (Signed)
E-Visit for Corona Virus Screening  Your current symptoms could be consistent with the coronavirus.  Many health care providers can now test patients at their office but not all are.  [facility] has multiple testing sites. For information on our COVID testing locations and hours go to [email or web address]  We are enrolling you in our MyChart Home Monitoring for COVID19 . Daily you will receive a questionnaire within the MyChart website. Our COVID 19 response team will be monitoring your responses daily.  Testing Information: The COVID-19 Community Testing sites are testing BY APPOINTMENT ONLY.  You can schedule online at [email or web address]  If you do not have access to a smart phone or computer you may call [phone] for an appointment.   Additional testing sites in the Community:  For [facility] Testing sites in [place]  https://www.[facility].com/[facility]/covid-19-testing  For [facility] testing sites in [place]  https://covid19.ncdhhs.gov/about-covid-19/testing/find-my-testing-place/pop-testing-sites  For [facility] https://[place].gov[place]/human-services/health-department/coronavirus-covid-19-info/covid-19-testing  For [place] testing in [place] https://www[place].gov[place]/human-services/health-department/coronavirus-covid-19-info/covid-19-testing  For [facility] testing in [place]   [email or web address].[email or web address]  For  more information about community testing call [phone]   Please quarantine yourself while awaiting your test results. Please stay home for a minimum of 10 days from the first day of illness with improving symptoms and you have had 24 hours of no fever (without the use of Tylenol (Acetaminophen) Motrin (Ibuprofen) or any fever reducing medication).  Also - Do not get tested prior to returning to work because once you have had a positive test the test can stay positive for  more than a month in some cases.   You should wear a mask or cloth face covering over your nose and mouth if you must be around other people or animals, including pets (even at home). Try to stay at least 6 feet away from other people. This will protect the people around you.  Please continue good preventive care measures, including:  frequent hand-washing, avoid touching your face, cover coughs/sneezes, stay out of crowds and keep a 6 foot distance from others.  COVID-19 is a respiratory illness with symptoms that are similar to the flu. Symptoms are typically mild to moderate, but there have been cases of severe illness and death due to the virus.   The following symptoms may appear 2-14 days after exposure: Fever Cough Shortness of breath or difficulty breathing Chills Repeated shaking with chills Muscle pain Headache Sore throat New loss of taste or smell Fatigue Congestion or runny nose Nausea or vomiting Diarrhea  Go to the nearest hospital ED for assessment if fever/cough/breathlessness are severe or illness seems like a threat to life.  It is vitally important that if you feel that you have an infection such as this virus or any other virus that you stay home and away from places where you may spread it to others.  You should avoid contact with people age 65 and older.   You can use medication such as prescription cough medication called Tessalon Perles 100 mg. You may take 1-2 capsules every 8 hours as needed for cough, prescription anti-inflammatory called Naprosyn 500 mg. Take twice daily as needed for fever or body aches for 2 weeks, and prescription for Fluticasone nasal spray 2 sprays in each nostril one time per day  You may also take acetaminophen (Tylenol) as needed for fever.  Reduce your risk of any infection by using the same precautions used for avoiding the common cold or flu:  Wash your hands often with soap and   warm water for at least 20 seconds.  If soap and water  are not readily available, use an alcohol-based hand sanitizer with at least 60% alcohol.  If coughing or sneezing, cover your mouth and nose by coughing or sneezing into the elbow areas of your shirt or coat, into a tissue or into your sleeve (not your hands). Avoid shaking hands with others and consider head nods or verbal greetings only. Avoid touching your eyes, nose, or mouth with unwashed hands.  Avoid close contact with people who are sick. Avoid places or events with large numbers of people in one location, like concerts or sporting events. Carefully consider travel plans you have or are making. If you are planning any travel outside or inside the [place], visit the CDC's Travelers' Health webpage for the latest health notices. If you have some symptoms but not all symptoms, continue to monitor at home and seek medical attention if your symptoms worsen. If you are having a medical emergency, call 911.  HOME CARE Only take medications as instructed by your medical team. Drink plenty of fluids and get plenty of rest. A steam or ultrasonic humidifier can help if you have congestion.   GET HELP RIGHT AWAY IF YOU HAVE EMERGENCY WARNING SIGNS** FOR COVID-19. If you or someone is showing any of these signs seek emergency medical care immediately. Call 911 or proceed to your closest emergency facility if: You develop worsening high fever. Trouble breathing Bluish lips or face Persistent pain or pressure in the chest New confusion Inability to wake or stay awake You cough up blood. Your symptoms become more severe  **This list is not all possible symptoms. Contact your medical provider for any symptoms that are sever or concerning to you.  MAKE SURE YOU  Understand these instructions. Will watch your condition. Will get help right away if you are not doing well or get worse.  Your e-visit answers were reviewed by a board certified advanced clinical practitioner to complete your personal  care plan.  Depending on the condition, your plan could have included both over the counter or prescription medications.  If there is a problem please reply once you have received a response from your provider.  Your safety is important to us.  If you have drug allergies check your prescription carefully.    You can use MyChart to ask questions about today's visit, request a non-urgent call back, or ask for a work or school excuse for 24 hours related to this e-Visit. If it has been greater than 24 hours you will need to follow up with your provider, or enter a new e-Visit to address those concerns. You will get an e-mail in the next two days asking about your experience.  I hope that your e-visit has been valuable and will speed your recovery. Thank you for using e-visits.  I provided 5 minutes of non face-to-face time during this encounter for chart review and documentation.   

## 2021-10-16 ENCOUNTER — Telehealth: Payer: Self-pay | Admitting: Nurse Practitioner

## 2021-10-16 DIAGNOSIS — J069 Acute upper respiratory infection, unspecified: Secondary | ICD-10-CM

## 2021-10-16 MED ORDER — LIDOCAINE VISCOUS HCL 2 % MT SOLN: 5.0000 mL | OROMUCOSAL | 0 refills | Status: AC | PRN

## 2021-10-16 NOTE — Progress Notes (Signed)
E-Visit for Sore Throat  We are sorry that you are not feeling well.  Here is how we plan to help!  Your symptoms indicate a likely viral infection (Pharyngitis).   Pharyngitis is inflammation in the back of the throat which can cause a sore throat, scratchiness and sometimes difficulty swallowing.   Pharyngitis is typically caused by a respiratory virus and will just run its course.  Please keep in mind that your symptoms could last up to 10 days.  For throat pain, we recommend over the counter oral pain relief medications such as acetaminophen or aspirin, or anti-inflammatory medications such as ibuprofen or naproxen sodium.  Topical treatments such as oral throat lozenges or sprays may be used as needed.  Avoid close contact with loved ones, especially the very young and elderly.  Remember to wash your hands thoroughly throughout the day as this is the number one way to prevent the spread of infection and wipe down door knobs and counters with disinfectant.  We can prescribe a mouth wash/gargle that can help decrease the pain in your throat.   Meds ordered this encounter  Medications   lidocaine (XYLOCAINE) 2 % solution    Sig: Use as directed 5 mLs in the mouth or throat every 4 (four) hours as needed for up to 5 days for mouth pain (sore throat). Swish, gargle and spit solution up to every 4 hours as needed for sore throat.    Dispense:  150 mL    Refill:  0      Also given your age, if you have not had mono in the past, you should consider getting tested for Mono. This can cause an ongoing sore throat with other viral symptoms. You can get tested for Mono at any urgent care or [facility] location, or with your primary care doctor's office.   After careful review of your answers, I would not recommend an antibiotic for your condition.  Antibiotics should not be used to treat conditions that we suspect are caused by viruses like the virus that causes the common cold or flu. However, some  people can have Strep with atypical symptoms. You may need formal testing in clinic or office to confirm if your symptoms continue or worsen.  Providers prescribe antibiotics to treat infections caused by bacteria. Antibiotics are very powerful in treating bacterial infections when they are used properly.  To maintain their effectiveness, they should be used only when necessary.  Overuse of antibiotics has resulted in the development of super bugs that are resistant to treatment!    Home Care: Only take medications as instructed by your medical team. Do not drink alcohol while taking these medications. A steam or ultrasonic humidifier can help congestion.  You can place a towel over your head and breathe in the steam from hot water coming from a faucet. Avoid close contacts especially the very young and the elderly. Cover your mouth when you cough or sneeze. Always remember to wash your hands.  Get Help Right Away If: You develop worsening fever or throat pain. You develop a severe head ache or visual changes. Your symptoms persist after you have completed your treatment plan.  Make sure you Understand these instructions. Will watch your condition. Will get help right away if you are not doing well or get worse.   Thank you for choosing an e-visit.  Your e-visit answers were reviewed by a board certified advanced clinical practitioner to complete your personal care plan. Depending upon the  condition, your plan could have included both over the counter or prescription medications.  Please review your pharmacy choice. Make sure the pharmacy is open so you can pick up prescription now. If there is a problem, you may contact your provider through [employer] and have the prescription routed to another pharmacy.  Your safety is important to [place]. If you have drug allergies check your prescription carefully.   For the next 24 hours you can use MyChart to ask questions about today's visit,  request a non-urgent call back, or ask for a work or school excuse. You will get an email in the next two days asking about your experience. I hope that your e-visit has been valuable and will speed your recovery.   I spent approximately 7 minutes reviewing the patient's history, current symptoms and coordinating their plan of care today.

## 2021-10-18 ENCOUNTER — Telehealth: Payer: Self-pay | Admitting: Physician Assistant

## 2021-10-18 DIAGNOSIS — J069 Acute upper respiratory infection, unspecified: Secondary | ICD-10-CM

## 2021-10-19 MED ORDER — PSEUDOEPH-BROMPHEN-DM 30-2-10 MG/5ML PO SYRP: 5.0000 mL | ORAL_SOLUTION | Freq: Four times a day (QID) | ORAL | 0 refills | Status: DC | PRN

## 2021-10-19 MED ORDER — IPRATROPIUM BROMIDE 0.03 % NA SOLN: 2.0000 | Freq: Two times a day (BID) | NASAL | 0 refills | Status: DC

## 2021-10-19 NOTE — Progress Notes (Signed)
E-Visit for Upper Respiratory Infection   We are sorry you are not feeling well.  Here is how we plan to help!  Based on what you have shared with me, it looks like you may have a viral upper respiratory infection.  Upper respiratory infections are caused by a large number of viruses; however, rhinovirus is the most common cause.   Symptoms vary from person to person, with common symptoms including sore throat, cough, fatigue or lack of energy and feeling of general discomfort.  A low-grade fever of up to 100.4 may present, but is often uncommon.  Symptoms vary however, and are closely related to a person's age or underlying illnesses.  The most common symptoms associated with an upper respiratory infection are nasal discharge or congestion, cough, sneezing, headache and pressure in the ears and face.  These symptoms usually persist for about 3 to 10 days, but can last up to 2 weeks.  It is important to know that upper respiratory infections do not cause serious illness or complications in most cases.    Upper respiratory infections can be transmitted from person to person, with the most common method of transmission being a person's hands.  The virus is able to live on the skin and can infect other persons for up to 2 hours after direct contact.  Also, these can be transmitted when someone coughs or sneezes; thus, it is important to cover the mouth to reduce this risk.  To keep the spread of the illness at bay, good hand hygiene is very important.  This is an infection that is most likely caused by a virus. There are no specific treatments other than to help you with the symptoms until the infection runs its course.  We are sorry you are not feeling well.  Here is how we plan to help!  Providers prescribe antibiotics to treat infections caused by bacteria. Antibiotics are very powerful in treating bacterial infections when they are used properly. To maintain their effectiveness, they should be used only  when necessary. Overuse of antibiotics has resulted in the development of superbugs that are resistant to treatment!    After careful review of your answers, I would not recommend an antibiotic for your condition.  Antibiotics are not effective against viruses and therefore should not be used to treat them. Common examples of infections caused by viruses include colds and flu  For nasal congestion, you may use an oral decongestants such as Mucinex D or if you have glaucoma or high blood pressure use plain Mucinex.  Saline nasal spray or nasal drops can help and can safely be used as often as needed for congestion.  For your congestion, I have prescribed Ipratropium Bromide nasal spray 0.03% two sprays in each nostril 2-3 times a day  If you do not have a history of heart disease, hypertension, diabetes or thyroid disease, prostate/bladder issues or glaucoma, you may also use Sudafed to treat nasal congestion.  It is highly recommended that you consult with a pharmacist or your primary care physician to ensure this medication is safe for you to take.     If you have a cough, you may use cough suppressants such as Delsym and Robitussin.  If you have glaucoma or high blood pressure, you can also use Coricidin HBP.   For cough I have prescribed for you a cough syrup called Bromfed DM  If you have a sore or scratchy throat, use a saltwater gargle-  to  teaspoon of salt  dissolved in a 4-ounce to 8-ounce glass of warm water.  Gargle the solution for approximately 15-30 seconds and then spit.  It is important not to swallow the solution.  You can also use throat lozenges/cough drops and Chloraseptic spray to help with throat pain or discomfort.  Warm or cold liquids can also be helpful in relieving throat pain.  For headache, pain or general discomfort, you can use Ibuprofen or Tylenol as directed.   Some authorities believe that zinc sprays or the use of Echinacea may shorten the course of your  symptoms.   HOME CARE Only take medications as instructed by your medical team. Be sure to drink plenty of fluids. Water is fine as well as fruit juices, sodas and electrolyte beverages. You may want to stay away from caffeine or alcohol. If you are nauseated, try taking small sips of liquids. How do you know if you are getting enough fluid? Your urine should be a pale yellow or almost colorless. Get rest. Taking a steamy shower or using a humidifier may help nasal congestion and ease sore throat pain. You can place a towel over your head and breathe in the steam from hot water coming from a faucet. Using a saline nasal spray works much the same way. Cough drops, hard candies and sore throat lozenges may ease your cough. Avoid close contacts especially the very young and the elderly Cover your mouth if you cough or sneeze Always remember to wash your hands.   GET HELP RIGHT AWAY IF: You develop worsening fever. If your symptoms do not improve within 10 days You develop yellow or green discharge from your nose over 3 days. You have coughing fits You develop a severe head ache or visual changes. You develop shortness of breath, difficulty breathing or start having chest pain Your symptoms persist after you have completed your treatment plan  MAKE SURE YOU  Understand these instructions. Will watch your condition. Will get help right away if you are not doing well or get worse.  Thank you for choosing an e-visit.  Your e-visit answers were reviewed by a board certified advanced clinical practitioner to complete your personal care plan. Depending upon the condition, your plan could have included both over the counter or prescription medications.  Please review your pharmacy choice. Make sure the pharmacy is open so you can pick up prescription now. If there is a problem, you may contact your provider through [employer] and have the prescription routed to another pharmacy.  Your  safety is important to [place]. If you have drug allergies check your prescription carefully.   For the next 24 hours you can use MyChart to ask questions about today's visit, request a non-urgent call back, or ask for a work or school excuse. You will get an email in the next two days asking about your experience. I hope that your e-visit has been valuable and will speed your recovery.   I provided 5 minutes of non face-to-face time during this encounter for chart review and documentation.

## 2021-11-25 ENCOUNTER — Ambulatory Visit (INDEPENDENT_AMBULATORY_CARE_PROVIDER_SITE_OTHER): Payer: 59 | Admitting: Endocrinology

## 2021-11-25 ENCOUNTER — Other Ambulatory Visit: Payer: Self-pay

## 2021-11-25 ENCOUNTER — Encounter: Payer: Self-pay | Admitting: Endocrinology

## 2021-11-25 VITALS — BP 122/80 | HR 89 | Ht 67.5 in | Wt 195.4 lb

## 2021-11-25 DIAGNOSIS — Z789 Other specified health status: Secondary | ICD-10-CM

## 2021-11-25 LAB — CBC WITH DIFFERENTIAL/PLATELET
Basophils Absolute: 0 10*3/uL (ref 0.0–0.1)
Basophils Relative: 0.5 % (ref 0.0–3.0)
Eosinophils Absolute: 0.1 10*3/uL (ref 0.0–0.7)
Eosinophils Relative: 1.3 % (ref 0.0–5.0)
HCT: 48.1 % — ABNORMAL HIGH (ref 36.0–46.0)
Hemoglobin: 16.2 g/dL — ABNORMAL HIGH (ref 12.0–15.0)
Lymphocytes Relative: 24.5 % (ref 12.0–46.0)
Lymphs Abs: 1.8 10*3/uL (ref 0.7–4.0)
MCHC: 33.7 g/dL (ref 30.0–36.0)
MCV: 91.1 fl (ref 78.0–100.0)
Monocytes Absolute: 0.5 10*3/uL (ref 0.1–1.0)
Monocytes Relative: 7.3 % (ref 3.0–12.0)
Neutro Abs: 4.9 10*3/uL (ref 1.4–7.7)
Neutrophils Relative %: 66.4 % (ref 43.0–77.0)
Platelets: 159 10*3/uL (ref 150.0–400.0)
RBC: 5.28 Mil/uL — ABNORMAL HIGH (ref 3.87–5.11)
RDW: 13.8 % (ref 11.5–15.5)
WBC: 7.3 10*3/uL (ref 4.0–10.5)

## 2021-11-25 LAB — T4, FREE: Free T4: 0.7 ng/dL (ref 0.60–1.60)

## 2021-11-25 LAB — TSH: TSH: 1.7 u[IU]/mL (ref 0.35–5.50)

## 2021-11-25 NOTE — Patient Instructions (Signed)
Blood tests are requested for you today.  We'll let you know about the results.  ?Based on the results, I hope to be able to prescribe for you a pill against the estrogen.   ?Please let me know if you would like a referral for surgery.   ?Please come back for a follow-up appointment in 6 months.   ?

## 2021-11-25 NOTE — Progress Notes (Signed)
? ?Subjective:  ? ? Patient ID: Ann Gardner, adult    DOB: [birth year], 21 y.o.   MRN: [id number] ? 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  ?SDOH: Hx is mostly from pt's mother, as pt says very little.   ?Interval hx: Last shot was 2 weeks ago.  He has no menses.  Anxiety is much better.  Body hair is minimal.  Pt is here alone today.  Voice is deepening.   ?Past Medical History:  ?Diagnosis Date  ? Anxiety   ? Depression   ? OCD (obsessive compulsive disorder)   ? Social anxiety disorder   ? ? ?No past surgical history on file. ? ?Social History  ? ?Socioeconomic History  ? Marital status: Single  ?  Spouse name: Not on file  ? Number of children: Not on file  ? Years of education: Not on file  ? Highest education level: Not on file  ?Occupational History  ? Not on file  ?Tobacco Use  ? Smoking status: Passive Smoke Exposure - Never Smoker  ? Smokeless tobacco: Never  ? Tobacco comments:  ?  smokes outside only  ?Substance and Sexual Activity  ? Alcohol use: Not on file  ? Drug use: Not on file  ? Sexual activity: Not on file  ?Other Topics Concern  ? Not on file  ?Social History Narrative  ? Lives with mom, grandma, and step-dad.   ? He is a Engineer, agricultural.  ? He attended [employer].   ? He enjoys playing video games, listening to music, and sleeping.   ? ?Social Determinants of Health  ? ?Financial Resource Strain: Not on file  ?Food Insecurity: Not on file  ?Transportation Needs: Not on file  ?Physical Activity: Not on file  ?Stress: Not on file  ?Social Connections: Not on file  ?Intimate Partner Violence: Not on file  ? ? ?Current Outpatient Medications on File Prior to Visit  ?Medication Sig Dispense Refill  ? benzonatate (TESSALON) 100 MG capsule Take 1 capsule (100 mg total) by mouth 3 (three) times daily as needed. 30 capsule 0  ?  brompheniramine-pseudoephedrine-DM 30-2-10 MG/5ML syrup Take 5 mLs by mouth 4 (four) times daily as needed. 120 mL 0  ? buPROPion (WELLBUTRIN XL) 150 MG 24 hr tablet Take 225 mg by mouth daily.     ? fluticasone (FLONASE) 50 MCG/ACT nasal spray Place 2 sprays into both nostrils daily. 16 g 0  ? ipratropium (ATROVENT) 0.03 % nasal spray Place 2 sprays into both nostrils every 12 (twelve) hours. 30 mL 0  ? lidocaine (XYLOCAINE) 2 % solution 90mL swish and swallow every 4 hours as needed for throat pain 100 mL 0  ? naproxen (NAPROSYN) 500 MG tablet Take 1 tablet (500 mg total) by mouth 2 (two) times daily with a meal. 30 tablet 0  ? testosterone cypionate (DEPO-TESTOSTERONE) 100 MG/ML injection Inject 0.8 mLs (80 mg total) into the muscle every 14 (fourteen) days. And syringes 1/week 10 mL 0  ? Tuberculin-Allergy Syringes (B-D TB SYRINGE 1CC/22GX1") 22G X 1" 1 ML MISC Use with testosterone. 0.5 ml = 5 units 10 each 4  ? ?No current facility-administered medications on file prior to visit.  ? ? ?Allergies  ?Allergen Reactions  ? Red Dye   ? Sulfa Antibiotics   ? ? ?Family History  ?Problem Relation Age of  Onset  ? Osteoporosis Maternal Grandmother   ? Hypertension Maternal Grandmother   ? Lung cancer Maternal Grandfather   ? Hypertension Maternal Grandfather   ? ? ?BP 122/80   Pulse 89   Ht 5' 7.5" (1.715 m)   Wt 195 lb 6.4 oz (88.6 kg)   SpO2 97%   BMI 30.15 kg/m?  ? ? ?Review of Systems ?No genital change.   ?   ?Objective:  ? Physical Exam ?VITAL SIGNS:  See vs page ?GENERAL: no distress ?EXT: no leg edema.   ?SKIN: moderate terminal hair on the legs.   ? ? ?   ?Assessment & Plan:  ?Transgender state, uncontrolled.  I advised pt to add E2 blocker.  He agrees ?I have sent a prescription to your pharmacy, to add anastrazole.   ?Check labs today ? ?

## 2021-11-27 MED ORDER — ANASTROZOLE 1 MG PO TABS: 1.0000 mg | ORAL_TABLET | Freq: Every day | ORAL | 3 refills | Status: DC

## 2021-12-01 LAB — TESTOSTERONE,FREE AND TOTAL
Testosterone, Free: 8.7 pg/mL — ABNORMAL HIGH (ref 0.0–4.2)
Testosterone: 283 ng/dL — ABNORMAL HIGH (ref 13–71)

## 2021-12-05 LAB — ESTRADIOL, FREE
Estradiol, Free: 0.8 pg/mL
Estradiol: 36 pg/mL

## 2021-12-06 ENCOUNTER — Other Ambulatory Visit: Payer: Self-pay | Admitting: Endocrinology

## 2021-12-08 ENCOUNTER — Other Ambulatory Visit: Payer: Self-pay | Admitting: Endocrinology

## 2021-12-08 MED ORDER — TESTOSTERONE CYPIONATE 100 MG/ML IM SOLN: 60.0000 mg | INTRAMUSCULAR | 0 refills | Status: DC

## 2022-01-09 ENCOUNTER — Telehealth: Payer: 59 | Admitting: Nurse Practitioner

## 2022-01-09 DIAGNOSIS — U071 COVID-19: Secondary | ICD-10-CM | POA: Diagnosis not present

## 2022-01-10 MED ORDER — BENZONATATE 100 MG PO CAPS: 100.0000 mg | ORAL_CAPSULE | Freq: Three times a day (TID) | ORAL | 0 refills | Status: DC | PRN

## 2022-01-10 NOTE — Progress Notes (Signed)
? ? ?E-Visit  for Positive Covid Test Result ? ?We are sorry you are not feeling well. We are here to help! ? ?You have tested positive for COVID-19, meaning that you were infected with the novel coronavirus and could give the virus to others.  It is vitally important that you stay home so you do not spread it to others.     ? ?Please continue isolation at home, for at least 10 days since the start of your symptoms and until you have had 24 hours with no fever (without taking a fever reducer) and with improving of symptoms.  If you have no symptoms but tested positive (or all symptoms resolve after 5 days and you have no fever) you can leave your house but continue to wear a mask around others for an additional 5 days. If you have a fever,continue to stay home until you have had 24 hours of no fever. Most cases improve 5-10 days from onset but we have seen a small number of patients who have gotten worse after the 10 days.  Please be sure to watch for worsening symptoms and remain taking the proper precautions.  ? ?Go to the nearest hospital ED for assessment if fever/cough/breathlessness are severe or illness seems like a threat to life.   ? ?The following symptoms may appear 2-14 days after exposure: ?Fever ?Cough ?Shortness of breath or difficulty breathing ?Chills ?Repeated shaking with chills ?Muscle pain ?Headache ?Sore throat ?New loss of taste or smell ?Fatigue ?Congestion or runny nose ?Nausea or vomiting ?Diarrhea ? ?You have been enrolled in MyChart Home Monitoring for COVID-19. Daily you will receive a questionnaire within the MyChart website. Our COVID-19 response team will be monitoring your responses daily. ? ?You can use medication such as prescription cough medication called Tessalon Perles 100 mg. You may take 1-2 capsules every 8 hours as needed for cough ? ?You may also take acetaminophen (Tylenol) as needed for fever. ? ?HOME CARE: ?Only take medications as instructed by your medical  team. ?Drink plenty of fluids and get plenty of rest. ?A steam or ultrasonic humidifier can help if you have congestion.  ? ?GET HELP RIGHT AWAY IF YOU HAVE EMERGENCY WARNING SIGNS.  ?Call 911 or proceed to your closest emergency facility if: ?You develop worsening high fever. ?Trouble breathing ?Bluish lips or face ?Persistent pain or pressure in the chest ?New confusion ?Inability to wake or stay awake ?You cough up blood. ?Your symptoms become more severe ?Inability to hold down food or fluids ? ?This list is not all possible symptoms. Contact your medical provider for any symptoms that are severe or concerning to you. ? ? ? ?Your e-visit answers were reviewed by a board certified advanced clinical practitioner to complete your personal care plan.  Depending on the condition, your plan could have included both over the counter or prescription medications.  If there is a problem please reply once you have received a response from your provider. ? ?Your safety is important to [place].  If you have drug allergies check your prescription carefully.   ? ?You can use MyChart to ask questions about today's visit, request a non-urgent call back, or ask for a work or school excuse for 24 hours related to this e-Visit. If it has been greater than 24 hours you will need to follow up with your provider, or enter a new e-Visit to address those concerns. ?You will get an e-mail in the next two days asking about your experience.  I hope that your e-visit has been valuable and will speed your recovery. Thank you for using e-visits. ? ?I spent approximately 7 minutes reviewing the patient's history, current symptoms and coordinating their plan of care today.   ? ?Meds ordered this encounter  ?Medications  ? benzonatate (TESSALON) 100 MG capsule  ?  Sig: Take 1 capsule (100 mg total) by mouth 3 (three) times daily as needed.  ?  Dispense:  30 capsule  ?  Refill:  0  ?  ?

## 2022-01-16 ENCOUNTER — Telehealth: Payer: 59 | Admitting: Family

## 2022-01-16 DIAGNOSIS — U071 COVID-19: Secondary | ICD-10-CM

## 2022-01-16 MED ORDER — BENZONATATE 100 MG PO CAPS
100.0000 mg | ORAL_CAPSULE | Freq: Three times a day (TID) | ORAL | 0 refills | Status: DC | PRN
Start: 2022-01-16 — End: 2022-05-05

## 2022-01-16 NOTE — Progress Notes (Signed)
E-Visit for [employer] Virus Screening ? ?Your current symptoms could be consistent with the coronavirus.  Many health care providers can now test patients at their office but not all are.  [place] has multiple testing sites. For information on our COVID testing locations and hours go to [email or web address] ? ?We are enrolling you in our MyChart Home Monitoring for COVID19 . Daily you will receive a questionnaire within the MyChart website. Our COVID 19 response team will be monitoring your responses daily. ? ?Testing Information: ?The COVID-19 Community Testing sites are testing BY APPOINTMENT ONLY.  You can schedule online at [email or web address]  If you do not have access to a smart phone or computer you may call [phone] for an appointment. ? ? ?Additional testing sites in the Community: ? ?For [facility] Testing sites in [place]  [email or web address] ? ?For [facility] testing sites in [place]  [email or web address] ? ?For [facility] [email or web address] ? ?For [facility] testing in [place] and [place] [email or web address] ? ?For [facility] testing in [facility]   [email or web address] ? ?For  more information about community testing call [phone] ? ? ?Please quarantine yourself while awaiting your test results. Please stay home for a minimum of 10 days from the first day of illness with improving symptoms and you have had 24 hours of no fever (without the use of Tylenol (Acetaminophen) Motrin (Ibuprofen) or any fever reducing medication).  Also - Do not get tested prior to returning to work because once you have had a positive test the test can stay positive for  more than a month in some cases.  ? ?You should wear a mask or cloth face covering over your nose and mouth if you must be around other people or animals, including pets (even at home). Try to stay at least 6 feet away from other people. This will protect the people around you.  Please continue good preventive care measures, including:  frequent hand-washing, avoid touching your face, cover coughs/sneezes, stay out of crowds and keep a 6 foot distance from others.  COVID-19 is a respiratory illness with symptoms that are similar to the flu. Symptoms are typically mild to moderate, but there have been cases of severe illness and death due to the virus.  ? ?The following symptoms may appear 2-14 days after exposure: ?Fever ?Cough ?Shortness of breath or difficulty breathing ?Chills ?Repeated shaking with chills ?Muscle pain ?Headache ?Sore throat ?New loss of taste or smell ?Fatigue ?Congestion or runny nose ?Nausea or vomiting ?Diarrhea ? ?Go to the nearest hospital ED for assessment if fever/cough/breathlessness are severe or illness seems like a threat to life.  It is vitally important that if you feel that you have an infection such as this virus or any other virus that you stay home and away from places where you may spread it to others.  You should avoid contact with people age 74 and older.  ? ?You can use medication such as prescription cough medication called Tessalon Perles 100 mg. You may take 1-2 capsules every 8 hours as needed for cough and prescription for Fluticasone nasal spray 2 sprays in each nostril one time per day. You should start a Mucinex twice a day. This will help with the congestion.  ? ?You may also take acetaminophen (Tylenol) as needed for fever. ? ?Reduce your risk of any infection by using the same precautions used for avoiding the common cold or flu:  ?Wash your hands often with soap and warm water for at  least 20 seconds.  If soap and water are not readily available, use an  alcohol-based hand sanitizer with at least 60% alcohol.  ?If coughing or sneezing, cover your mouth and nose by coughing or sneezing into the elbow areas of your shirt or coat, into a tissue or into your sleeve (not your hands). ?Avoid shaking hands with others and consider head nods or verbal greetings only. ?Avoid touching your eyes, nose, or mouth with unwashed hands.  ?Avoid close contact with people who are sick. ?Avoid places or events with large numbers of people in one location, like concerts or sporting events. ?Carefully consider travel plans you have or are making. ?If you are planning any travel outside or inside the [place], visit the CDC's Travelers' Health webpage for the latest health notices. ?If you have some symptoms but not all symptoms, continue to monitor at home and seek medical attention if your symptoms worsen. ?If you are having a medical emergency, call 911. ? ?HOME CARE ?Only take medications as instructed by your medical team. ?Drink plenty of fluids and get plenty of rest. ?A steam or ultrasonic humidifier can help if you have congestion.  ? ?GET HELP RIGHT AWAY IF YOU HAVE EMERGENCY WARNING SIGNS** FOR COVID-19. If you or someone is showing any of these signs seek emergency medical care immediately. Call 911 or proceed to your closest emergency facility if: ?You develop worsening high fever. ?Trouble breathing ?Bluish lips or face ?Persistent pain or pressure in the chest ?New confusion ?Inability to wake or stay awake ?You cough up blood. ?Your symptoms become more severe ? ?**This list is not all possible symptoms. Contact your medical provider for any symptoms that are sever or concerning to you. ? ?MAKE SURE YOU  ?Understand these instructions. ?Will watch your condition. ?Will get help right away if you are not doing well or get worse. ? ?Your e-visit answers were reviewed by a board certified advanced clinical practitioner to complete your personal care plan.  Depending on the  condition, your plan could have included both over the counter or prescription medications.  If there is a problem please reply once you have received a response from your provider. ? ?Your safety is important to [place].  If you have drug allergies check your prescription carefully.   ? ?You can use MyChart to ask questions about today's visit, request a non-urgent call back, or ask for a work or school excuse for 24 hours related to this e-Visit. If it has been greater than 24 hours you will need to follow up with your provider, or enter a new e-Visit to address those concerns. ?You will get an e-mail in the next two days asking about your experience.  I hope that your e-visit has been valuable and will speed your recovery. Thank you for using e-visits. ? ?Approximately 5 minutes was spent documenting and reviewing patient's chart.  ? ?

## 2022-02-13 ENCOUNTER — Telehealth: Payer: 59 | Admitting: Nurse Practitioner

## 2022-02-13 DIAGNOSIS — R112 Nausea with vomiting, unspecified: Secondary | ICD-10-CM

## 2022-02-13 MED ORDER — ONDANSETRON HCL 4 MG PO TABS
4.0000 mg | ORAL_TABLET | Freq: Three times a day (TID) | ORAL | 0 refills | Status: DC | PRN
Start: 2022-02-13 — End: 2022-05-05

## 2022-02-13 NOTE — Progress Notes (Signed)
E-Visit for Nausea and Vomiting   We are sorry that you are not feeling well. Here is how we plan to help!  Based on what you have shared with me it looks like you have a Virus that is irritating your GI tract.  Vomiting is the forceful emptying of a portion of the stomach's content through the mouth.  Although nausea and vomiting can make you feel miserable, it's important to remember that these are not diseases, but rather symptoms of an underlying illness.  When we treat short term symptoms, we always caution that any symptoms that persist should be fully evaluated in a medical office.  I have prescribed a medication that will help alleviate your symptoms and allow you to stay hydrated:  Zofran 4 mg 1 tablet every 8 hours as needed for nausea and vomiting  HOME CARE: Drink clear liquids.  This is very important! Dehydration (the lack of fluid) can lead to a serious complication.  Start off with 1 tablespoon every 5 minutes for 8 hours. You may begin eating bland foods after 8 hours without vomiting.  Start with saltine crackers, white bread, rice, mashed potatoes, applesauce. After 48 hours on a bland diet, you may resume a normal diet. Try to go to sleep.  Sleep often empties the stomach and relieves the need to vomit.  GET HELP RIGHT AWAY IF:  Your symptoms do not improve or worsen within 2 days after treatment. You have a fever for over 3 days. You cannot keep down fluids after trying the medication.  MAKE SURE YOU:  Understand these instructions. Will watch your condition. Will get help right away if you are not doing well or get worse.    Thank you for choosing an e-visit.  Your e-visit answers were reviewed by a board certified advanced clinical practitioner to complete your personal care plan. Depending upon the condition, your plan could have included both over the counter or prescription medications.  Please review your pharmacy choice. Make sure the pharmacy is open so  you can pick up prescription now. If there is a problem, you may contact your provider through MyChart messaging and have the prescription routed to another pharmacy.  Your safety is important to us. If you have drug allergies check your prescription carefully.   For the next 24 hours you can use MyChart to ask questions about today's visit, request a non-urgent call back, or ask for a work or school excuse. You will get an email in the next two days asking about your experience. I hope that your e-visit has been valuable and will speed your recovery.  

## 2022-02-13 NOTE — Progress Notes (Signed)
I have spent at least 5 minutes reviewing and documenting in the patient's chart.  

## 2022-05-04 ENCOUNTER — Telehealth: Payer: 59 | Admitting: Physician Assistant

## 2022-05-04 DIAGNOSIS — K529 Noninfective gastroenteritis and colitis, unspecified: Secondary | ICD-10-CM

## 2022-05-05 ENCOUNTER — Encounter (HOSPITAL_BASED_OUTPATIENT_CLINIC_OR_DEPARTMENT_OTHER): Payer: Self-pay

## 2022-05-05 ENCOUNTER — Emergency Department (HOSPITAL_BASED_OUTPATIENT_CLINIC_OR_DEPARTMENT_OTHER)
Admission: EM | Admit: 2022-05-05 | Discharge: 2022-05-05 | Disposition: A | Discharge status: Home / Self Care | Payer: 59 | Attending: Emergency Medicine | Admitting: Emergency Medicine

## 2022-05-05 ENCOUNTER — Emergency Department (HOSPITAL_BASED_OUTPATIENT_CLINIC_OR_DEPARTMENT_OTHER): Payer: 59

## 2022-05-05 DIAGNOSIS — I880 Nonspecific mesenteric lymphadenitis: Secondary | ICD-10-CM | POA: Diagnosis not present

## 2022-05-05 DIAGNOSIS — R112 Nausea with vomiting, unspecified: Secondary | ICD-10-CM

## 2022-05-05 DIAGNOSIS — R109 Unspecified abdominal pain: Secondary | ICD-10-CM | POA: Diagnosis present

## 2022-05-05 DIAGNOSIS — R1084 Generalized abdominal pain: Secondary | ICD-10-CM

## 2022-05-05 LAB — CBC
HCT: 45.2 % (ref 36.0–46.0)
Hemoglobin: 15.8 g/dL — ABNORMAL HIGH (ref 12.0–15.0)
MCH: 31.1 pg (ref 26.0–34.0)
MCHC: 35 g/dL (ref 30.0–36.0)
MCV: 89 fL (ref 80.0–100.0)
Platelets: 175 10*3/uL (ref 150–400)
RBC: 5.08 MIL/uL (ref 3.87–5.11)
RDW: 12.9 % (ref 11.5–15.5)
WBC: 7.8 10*3/uL (ref 4.0–10.5)
nRBC: 0 % (ref 0.0–0.2)

## 2022-05-05 LAB — URINALYSIS, MICROSCOPIC (REFLEX)

## 2022-05-05 LAB — URINALYSIS, ROUTINE W REFLEX MICROSCOPIC
Bilirubin Urine: NEGATIVE
Glucose, UA: NEGATIVE mg/dL
Ketones, ur: NEGATIVE mg/dL
Nitrite: NEGATIVE
Protein, ur: NEGATIVE mg/dL
Specific Gravity, Urine: 1.025 (ref 1.005–1.030)
pH: 6 (ref 5.0–8.0)

## 2022-05-05 LAB — COMPREHENSIVE METABOLIC PANEL
ALT: 14 U/L (ref 0–44)
AST: 17 U/L (ref 15–41)
Albumin: 4.5 g/dL (ref 3.5–5.0)
Alkaline Phosphatase: 58 U/L (ref 38–126)
Anion gap: 8 (ref 5–15)
BUN: 7 mg/dL (ref 6–20)
CO2: 27 mmol/L (ref 22–32)
Calcium: 9.1 mg/dL (ref 8.9–10.3)
Chloride: 102 mmol/L (ref 98–111)
Creatinine, Ser: 0.98 mg/dL (ref 0.44–1.00)
GFR, Estimated: >60 mL/min (ref >60)
Glucose, Bld: 96 mg/dL (ref 70–99)
Potassium: 3.8 mmol/L (ref 3.5–5.1)
Sodium: 137 mmol/L (ref 135–145)
Total Bilirubin: 1.1 mg/dL (ref 0.3–1.2)
Total Protein: 8.1 g/dL (ref 6.5–8.1)

## 2022-05-05 LAB — LIPASE, BLOOD: Lipase: 28 U/L (ref 11–51)

## 2022-05-05 LAB — PREGNANCY, URINE: Preg Test, Ur: NEGATIVE

## 2022-05-05 MED ORDER — ONDANSETRON HCL 4 MG/2ML IJ SOLN
4.0000 mg | Freq: Once | INTRAMUSCULAR | Status: AC
  Administered 2022-05-05: 4 mg via INTRAVENOUS
  Filled 2022-05-05: qty 2

## 2022-05-05 MED ORDER — ONDANSETRON 4 MG PO TBDP: 4.0000 mg | ORAL_TABLET | Freq: Three times a day (TID) | ORAL | 0 refills | Status: DC | PRN

## 2022-05-05 MED ORDER — ONDANSETRON HCL 4 MG PO TABS: 4.0000 mg | ORAL_TABLET | Freq: Three times a day (TID) | ORAL | 0 refills | Status: AC | PRN

## 2022-05-05 MED ORDER — SODIUM CHLORIDE 0.9 % IV BOLUS
1000.0000 mL | Freq: Once | INTRAVENOUS | Status: AC
Start: 2022-05-05 — End: 2022-05-05
  Administered 2022-05-05: 1000 mL via INTRAVENOUS

## 2022-05-05 MED ORDER — IOHEXOL 300 MG/ML  SOLN
100.0000 mL | Freq: Once | INTRAMUSCULAR | Status: AC | PRN
  Administered 2022-05-05: 100 mL via INTRAVENOUS

## 2022-05-05 NOTE — ED Provider Notes (Signed)
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 adult. Patient presents the ED with chief complaint of abdominal pain that started about 3 days ago.  He says it is in his umbilical region and has not moved.  This has been a constant pain and feels like cramping.  He states that last night he started developing nausea and vomiting as well as diarrhea.  Has had a 8 episodes of diarrhea since then and has not been on to keep any food down.  He had an ED visit last night where he was prescribed Zofran but he has not picked this up yet.  Symptoms have not gotten any better so he presented to the ED. He does endorse a low-grade temperature to about 99.8 last night. Denies history of intra abdominal surgeries. This is a female to female transgender patient currently on testosterone therapy with female anatomical anatomy.   Abdominal Pain Associated symptoms: diarrhea, fever, nausea and vomiting   Associated symptoms: no chest pain, no dysuria, no shortness of breath, no sore throat and no vaginal discharge        Home Medications Prior to Admission medications   Medication Sig Start Date End Date Taking? Authorizing Provider  ondansetron (ZOFRAN) 4 MG tablet Take 1 tablet (4 mg total) by mouth every 8 (eight) hours as needed for up to 7 days for nausea or vomiting. 05/05/22 05/12/22 Yes Stryder Poitra, Finis Bud, PA-C  anastrozole (ARIMIDEX) 1 MG tablet Take 1 tablet (1 mg total) by mouth daily. 11/27/21   Romero Belling, MD  buPROPion (WELLBUTRIN XL) 150 MG 24 hr tablet Take 225 mg by mouth daily.     [provider]  fluticasone (FLONASE) 50 MCG/ACT nasal spray Place 2 sprays into both nostrils daily. 10/15/21   Margaretann Loveless, PA-C   ipratropium (ATROVENT) 0.03 % nasal spray Place 2 sprays into both nostrils every 12 (twelve) hours. 10/19/21   Margaretann Loveless, PA-C  lidocaine (XYLOCAINE) 2 % solution 53mL swish and swallow every 4 hours as needed for throat pain 07/28/21   Joycelyn Man M, PA-C  naproxen (NAPROSYN) 500 MG tablet Take 1 tablet (500 mg total) by mouth 2 (two) times daily with a meal. 10/15/21   Burnette, Alessandra Bevels, PA-C  ondansetron (ZOFRAN-ODT) 4 MG disintegrating tablet Take 1 tablet (4 mg total) by mouth every 8 (eight) hours as needed for nausea or vomiting. 05/05/22   Waldon Merl, PA-C  testosterone cypionate (DEPO-TESTOSTERONE) 100 MG/ML injection Inject 0.6 mLs (60 mg total) into the muscle every 14 (fourteen) days. And syringes 1/week 12/08/21   Romero Belling, MD  Tuberculin-Allergy Syringes (B-D TB SYRINGE 1CC/22GX1") 22G X 1" 1 ML MISC Use with testosterone. 0.5 ml = 5 units 04/29/19   Dessa Phi, MD      Allergies    Benzoyl peroxide, Famotidine, Sulfa antibiotics, Cephalexin, Dust mite extract, Grass pollen(k-o-r-t-swt vern), Acetaminophen, Red dye, and Other    Review of Systems   Review of Systems  Constitutional:  Positive for fever.  HENT:  Negative for sore throat.   Respiratory:  Negative for shortness of breath.   Cardiovascular:  Negative for chest pain.  Gastrointestinal:  Positive for abdominal pain, diarrhea, nausea and vomiting.  Genitourinary:  Negative for dysuria, flank pain, pelvic pain and vaginal discharge.  All other systems reviewed and are negative.   Physical Exam Updated Vital Signs BP 131/72 (BP Location: Right Arm)   Pulse (!) 101   Temp 100.1 F (37.8 C) (Oral)   Resp 17   Ht 5' 7.5" (1.715 m)   Wt 81.6 kg   SpO2 100%   BMI 27.78 kg/m   Physical Exam Constitutional:      General: He is not in acute distress.    Appearance: Normal appearance. He is not ill-appearing, toxic-appearing or diaphoretic.  HENT:     Head: Normocephalic and  atraumatic.  Eyes:     General: No scleral icterus.       Right eye: No discharge.        Left eye: No discharge.     Conjunctiva/sclera: Conjunctivae normal.  Pulmonary:     Effort: Pulmonary effort is normal.  Abdominal:     General: Abdomen is flat. There is no distension.     Palpations: Abdomen is soft.     Tenderness: There is abdominal tenderness. There is no right CVA tenderness, left CVA tenderness, guarding or rebound.     Comments: Umbilical, LLQ tenderness to palpation  Skin:    General: Skin is warm and dry.  Neurological:     Mental Status: He is alert and oriented to person, place, and time.  Psychiatric:        Mood and Affect: Mood normal.        Behavior: Behavior normal.      ED Results / Procedures / Treatments   Labs (all labs ordered are listed, but only abnormal results are displayed) Labs Reviewed  CBC - Abnormal; Notable for the following components:      Result Value   Hemoglobin 15.8 (*)    All other components within normal limits  URINALYSIS, ROUTINE W REFLEX MICROSCOPIC - Abnormal; Notable for the following components:   Hgb urine dipstick TRACE (*)    Leukocytes,Ua TRACE (*)    All other components within normal limits  URINALYSIS, MICROSCOPIC (REFLEX) - Abnormal; Notable for the following components:   Bacteria, UA RARE (*)    All other components within normal limits  LIPASE, BLOOD  COMPREHENSIVE METABOLIC PANEL  PREGNANCY, URINE    EKG None  Radiology CT Abdomen Pelvis W Contrast  Result Date: 05/05/2022 CLINICAL DATA:  Lower abdominal pain with vomiting and diarrhea for 3 days EXAM: CT ABDOMEN AND PELVIS WITH CONTRAST TECHNIQUE: Multidetector CT imaging of the abdomen and pelvis was performed using the standard protocol following bolus administration of intravenous contrast. RADIATION DOSE REDUCTION: This exam was performed according to the departmental dose-optimization program which includes automated exposure control, adjustment  of the mA and/or kV according to patient size and/or use of iterative reconstruction technique. CONTRAST:  OMNIPAQUE IOHEXOL 300 MG/ML  SOLN COMPARISON:  None Available. FINDINGS: Lower chest: No acute abnormality. Hepatobiliary: No focal liver abnormality is seen. No gallstones, gallbladder wall thickening, or biliary dilatation. Pancreas: Unremarkable. No pancreatic ductal dilatation or surrounding inflammatory changes. Spleen: Normal in size without focal abnormality. Adrenals/Urinary Tract: Unremarkable adrenal glands. Kidneys enhance symmetrically without focal lesion, stone, or hydronephrosis. Ureters are nondilated. Urinary bladder appears unremarkable for the degree of distension. Stomach/Bowel: Stomach is within normal limits. Tubular structure in the right lower quadrant favored to represent a normal appendix (series 12, image 64). No pericecal inflammatory changes to suggest appendicitis. No evidence of bowel wall thickening, distention, or  inflammatory changes. Vascular/Lymphatic: No significant vascular findings are present. There are a few mildly prominent, although non pathologically enlarged mesenteric lymph nodes in the right lower quadrant. No abdominal or pelvic lymphadenopathy by size criteria. Reproductive: Retroverted uterus.  No adnexal mass identified. Other: No free fluid. No abdominopelvic fluid collection. No pneumoperitoneum. No abdominal wall hernia. Musculoskeletal: No acute or significant osseous findings. IMPRESSION: 1. No acute abdominopelvic findings. 2. A few mildly prominent, although non-pathologically enlarged mesenteric lymph nodes in the right lower quadrant, which can be seen in the setting of mesenteric adenitis. Electronically Signed   By: Duanne Guess D.O.   On: 05/05/2022 15:35    Procedures Procedures   Medications Ordered in ED Medications  sodium chloride 0.9 % bolus 1,000 mL (0 mLs Intravenous Stopped 05/05/22 1550)  ondansetron (ZOFRAN) injection 4  mg (4 mg Intravenous Given 05/05/22 1439)  iohexol (OMNIPAQUE) 300 MG/ML solution 100 mL (100 mLs Intravenous Contrast Given 05/05/22 1459)    ED Course/ Medical Decision Making/ A&P                           Medical Decision Making Amount and/or Complexity of Data Reviewed Labs: ordered. Radiology: ordered.  Risk Prescription drug management.    MDM  This is a 21 y.o. adult who presents to the ED with abdominal pain, nausea, vomiting, and diarrhea The differential of this patient includes but is not limited to gastroenteritis, diverticulitis, appendicitis, etc.   Initial Impression  Well appearing Tachycardic to 109 here. HDS. Afebrile. + umbilical and lower abdominal tendeness. No guarding or rigidity. Evaluating with labs, and likely CT a/p  I personally ordered, reviewed, and interpreted all laboratory work and imaging and agree with radiologist interpretation. Results interpreted below: Labs reveal no leukocytosis, CMP is unremarkable, lipase negative, pregnancy is negative, urinalysis shows rare bacteria is unlikely consistent with urinary tract infection. We did have a CT abdomen pelvis which showed a few enlarged mesenteric lymph nodes in the right lower quadrant which could be seen in the setting of mesenteric adenitis, however this is overall nonspecific.  No other acute findings such as appendicitis, diverticulitis, intra-abdominal abscess, bowel obstruction or noted on CT today.  Assessment/Plan:  Patient presenting with 2 days of abdominal pain, nausea, vomiting, and diarrhea.  His workup is overall unremarkable with no leukocytosis noted on labs as well as no CT findings concerning for a an acute intra-abdominal pathology other than possible findings that could suggest mesenteric adenitis.  Discussed the results with the patient and that supportive treatment is was indicated in this setting.  I also recommended that he follow-up with his PCP for reevaluation.  His vitals  have remained stable while in the ED.  I feel like he is stable for discharge.   Charting Requirements Additional history is obtained from:  Independent historian External Records from outside source obtained and reviewed including: Prior Endocrine notes Social Determinants of Health:   Office manager PMH that complicates patient's illness: Transgender  Patient Care Problems that were addressed during this visit: - Generalized Abdominal Pain: Acute illness with systemic symptoms - Nausea, Vomiting, Diarrhea: Acute illness with systemic symptoms - Mesenteric Adenitis: Acute illness with systemic symptoms This patient was maintained on a cardiac monitor/telemetry. I personally viewed and interpreted the cardiac monitor which reveals an underlying rhythm of NSR/ST Medications given in ED: Declined Pain medications, Gave IVF Reevaluation of the patient after these medicines showed that the patient improved I have reviewed  home medications and made changes accordingly.  Critical Care Interventions: n/a Consultations: n/s Disposition: discharge  Portions of this note were generated with Dragon dictation software. Dictation errors may occur despite best attempts at proofreading.    Final Clinical Impression(s) / ED Diagnoses Final diagnoses:  Generalized abdominal pain  Nausea vomiting and diarrhea  Mesenteric adenitis    Rx / DC Orders ED Discharge Orders          Ordered    ondansetron (ZOFRAN) 4 MG tablet  Every 8 hours PRN        05/05/22 1606              Asianae Minkler, Finis Bud, PA-C 05/05/22 1710    Arby Barrette, MD 05/05/22 2150

## 2022-05-05 NOTE — ED Notes (Signed)
Pt unable to urinate at this time, urine spec cup given while waiting  

## 2022-05-05 NOTE — ED Notes (Signed)
Patient transported to CT 

## 2022-05-05 NOTE — Discharge Instructions (Signed)
I have provided you with the Zofran prescription for your nausea at home. Your CT showed mesenteric adenitis which is an inflammation of the mesentery lining of your intestines.  Please see the handout instructions on this.  No other concerning findings were found today. If you do not start to improve or if you worsen within 1 to 2 days, please return to the emergency department.

## 2022-05-05 NOTE — ED Triage Notes (Signed)
C/o abdominal pain & vomiting, diarrhea x 3 days.

## 2022-05-05 NOTE — Progress Notes (Signed)
We are sorry that you are not feeling well.  Here is how we plan to help!  Based on what you have shared with me it looks like you have Acute Infectious Diarrhea.  Most cases of acute diarrhea are due to infections with virus and bacteria and are self-limited conditions lasting less than 14 days.  For your symptoms you may take Imodium 2 mg tablets that are over the counter at your local pharmacy. Take two tablet now and then one after each loose stool up to 6 a day.  Antibiotics are not needed for most people with diarrhea.  I have prescribed  Zofran 4 mg 1 tablet every 8 hours as needed for nausea and vomiting  HOME CARE We recommend changing your diet to help with your symptoms for the next few days. Drink plenty of fluids that contain water salt and sugar. Sports drinks such as Gatorade may help.  You may try broths, soups, bananas, applesauce, soft breads, mashed potatoes or crackers.  You are considered infectious for as long as the diarrhea continues. Hand washing or use of alcohol based hand sanitizers is recommend. It is best to stay out of work or school until your symptoms stop.   GET HELP RIGHT AWAY If you have dark yellow colored urine or do not pass urine frequently you should drink more fluids.   If your symptoms worsen  If you feel like you are going to pass out (faint) You have a new problem  MAKE SURE YOU  Understand these instructions. Will watch your condition. Will get help right away if you are not doing well or get worse.  Thank you for choosing an e-visit.  Your e-visit answers were reviewed by a board certified advanced clinical practitioner to complete your personal care plan. Depending upon the condition, your plan could have included both over the counter or prescription medications.  Please review your pharmacy choice. Make sure the pharmacy is open so you can pick up prescription now. If there is a problem, you may contact your provider through MyChart  messaging and have the prescription routed to another pharmacy.  Your safety is important to us. If you have drug allergies check your prescription carefully.   For the next 24 hours you can use MyChart to ask questions about today's visit, request a non-urgent call back, or ask for a work or school excuse. You will get an email in the next two days asking about your experience. I hope that your e-visit has been valuable and will speed your recovery.  

## 2022-05-05 NOTE — Progress Notes (Signed)
I have spent 5 minutes in review of e-visit questionnaire, review and updating patient chart, medical decision making and response to patient.   Benna Arno Cody Jesscia Imm, PA-C    

## 2022-07-15 ENCOUNTER — Telehealth: Payer: 59 | Admitting: Family Medicine

## 2022-07-15 DIAGNOSIS — R198 Other specified symptoms and signs involving the digestive system and abdomen: Secondary | ICD-10-CM

## 2022-07-15 MED ORDER — NAPROXEN 500 MG PO TABS: 500.0000 mg | ORAL_TABLET | Freq: Two times a day (BID) | ORAL | 0 refills | Status: DC

## 2022-07-15 NOTE — Progress Notes (Signed)

## 2022-07-18 ENCOUNTER — Ambulatory Visit (INDEPENDENT_AMBULATORY_CARE_PROVIDER_SITE_OTHER): Payer: 59 | Admitting: Internal Medicine

## 2022-07-18 ENCOUNTER — Encounter: Payer: Self-pay | Admitting: Internal Medicine

## 2022-07-18 VITALS — BP 116/74 | HR 84 | Ht 67.5 in | Wt 187.0 lb

## 2022-07-18 DIAGNOSIS — F640 Transsexualism: Secondary | ICD-10-CM | POA: Insufficient documentation

## 2022-07-18 LAB — FOLLICLE STIMULATING HORMONE: FSH: 7.2 m[IU]/mL

## 2022-07-18 LAB — LUTEINIZING HORMONE: LH: 7.9 m[IU]/mL

## 2022-07-18 LAB — TESTOSTERONE: Testosterone: 113.8 ng/dL — ABNORMAL HIGH (ref 15.00–40.00)

## 2022-07-18 NOTE — Progress Notes (Signed)
Name: Ann Gardner  MRN/ DOB: [id number], 2001-07-20    Age/ Sex: 21 y.o., adult     PCP: Pcp, No   Reason for Endocrinology Evaluation: Gender Dysphoria      Initial Endocrinology Clinic Visit: 08/31/2020    PATIENT IDENTIFIER: Ann Gardner is a 21 y.o., adult with a past medical history of gender dysphoria , OCD. He has followed with [facility] since 08/31/2020 for consultative assistance with management of his gender dysphoria .   HISTORICAL SUMMARY: The patient was first diagnosed with gender dysphoria since 2011. Has been on gender affirming treatment since 2018  S/P breast reduction sx 2021   Dr. Everardo All had started the pt on Anastrazole 11/2021 but pt did not tolerate it due to feeling sick    SUBJECTIVE:    Today (07/18/2022):  Ann Gardner is here for a follow up on gender dysphoria.   Patient endorses severe needle phobia?  And at times he is having to ask other members to inject him with testosterone, when he has to give it to himself it takes him an hour of mental preparation Pt continues to see a therapist   No menstruations Minimal acne , noted mainly with stress  Minimal hair growth on the face    Testosterone 60 mg Q 14 days       HISTORY:  Past Medical History:  Past Medical History:  Diagnosis Date   Anxiety    Depression    OCD (obsessive compulsive disorder)    Social anxiety disorder    Past Surgical History: No past surgical history on file. Social History:  reports that he has never smoked. He has been exposed to tobacco smoke. He has never used smokeless tobacco. He reports that he does not drink alcohol and does not use drugs. Family History:  Family History  Problem Relation Age of Onset   Osteoporosis Maternal Grandmother    Hypertension Maternal Grandmother    Lung cancer Maternal Grandfather    Hypertension Maternal Grandfather      HOME MEDICATIONS: Allergies as of 07/18/2022       Reactions    Benzoyl Peroxide Dermatitis, Swelling   Face Soft Tissue Swelling at point of contact Face Soft Tissue Swelling at point of contact   Famotidine Swelling   Tongue and Throat Tongue and Throat   Sulfa Antibiotics Swelling   Tongue and Throat   Cephalexin Nausea And Vomiting   Patients mother states that Keflex causes nausea, vomiting, shakes    Dust Mite Extract Rash   Other reaction(s): Other (See Comments) Typical "Hay Fever" Symptoms Typical "Hay Fever" Symptoms   Grass Pollen(k-o-r-t-swt Vern) Rash   Typical "Hay Fever" Symptoms   Acetaminophen    Red Dye    Other    Other reaction(s): Other (See Comments) Typical "Hay Fever" Symptoms        Medication List        Accurate as of July 18, 2022  1:27 PM. If you have any questions, ask your nurse or doctor.          STOP taking these medications    fluticasone 50 MCG/ACT nasal spray Commonly known as: FLONASE Stopped by: Scarlette Shorts, MD   ipratropium 0.03 % nasal spray Commonly known as: ATROVENT Stopped by: Scarlette Shorts, MD   naproxen 500 MG tablet Commonly known as: NAPROSYN Stopped by: Scarlette Shorts, MD   ondansetron 4 MG disintegrating tablet Commonly known as: ZOFRAN-ODT Stopped by:  Dorita Sciara, MD       TAKE these medications    anastrozole 1 MG tablet Commonly known as: ARIMIDEX Take 1 tablet (1 mg total) by mouth daily.   B-D TB SYRINGE 1CC/22GX1" 22G X 1" 1 ML Misc Generic drug: Tuberculin-Allergy Syringes Use with testosterone. 0.5 ml = 5 units   buPROPion 150 MG 24 hr tablet Commonly known as: WELLBUTRIN XL Take 225 mg by mouth daily. What changed: Another medication with the same name was removed. Continue taking this medication, and follow the directions you see here. Changed by: Dorita Sciara, MD   lidocaine 2 % solution Commonly known as: XYLOCAINE 29mL swish and swallow every 4 hours as needed for throat pain   testosterone cypionate  100 MG/ML injection Commonly known as: Depo-Testosterone Inject 0.6 mLs (60 mg total) into the muscle every 14 (fourteen) days. And syringes 1/week          OBJECTIVE:   PHYSICAL EXAM: VS: BP 116/74 (BP Location: Left Arm, Patient Position: Sitting, Cuff Size: Small)   Pulse 84   Ht 5' 7.5" (1.715 m)   Wt 187 lb (84.8 kg)   SpO2 99%   BMI 28.86 kg/m    EXAM: General: Pt appears well and is in NAD  Eyes: External eye exam normal without stare, lid lag or exophthalmos.  EOM intact.    Neck: General: Supple without adenopathy. Thyroid: Thyroid size normal.  No goiter or nodules appreciated.   Lungs: Clear with good BS bilat with no rales, rhonchi, or wheezes  Heart: Auscultation: RRR.  Abdomen: Normoactive bowel sounds, soft, nontender, without masses or organomegaly palpable  Extremities:  BL LE: No pretibial edema normal ROM and strength.  Mental Status: Judgment, insight: Intact Orientation: Oriented to time, place, and person Mood and affect: No depression, anxiety, or agitation     DATA REVIEWED:   Latest Reference Range & Units 07/18/22 13:44  LH mIU/mL 7.90  FSH mIU/ML 7.2  Estradiol pg/mL 32  Testosterone 15.00 - 40.00 ng/dL 113.80 (H)    Latest Reference Range & Units 05/05/22 12:29  Sodium 135 - 145 mmol/L 137  Potassium 3.5 - 5.1 mmol/L 3.8  Chloride 98 - 111 mmol/L 102  CO2 22 - 32 mmol/L 27  Glucose 70 - 99 mg/dL 96  BUN 6 - 20 mg/dL 7  Creatinine 0.44 - 1.00 mg/dL 0.98  Calcium 8.9 - 10.3 mg/dL 9.1  Anion gap 5 - 15  8  Alkaline Phosphatase 38 - 126 U/L 58  Albumin 3.5 - 5.0 g/dL 4.5  Lipase 11 - 51 U/L 28  AST 15 - 41 U/L 17  ALT 0 - 44 U/L 14  Total Protein 6.5 - 8.1 g/dL 8.1  Total Bilirubin 0.3 - 1.2 mg/dL 1.1  GFR, Estimated >60 mL/min >60    Latest Reference Range & Units 05/05/22 12:29  WBC 4.0 - 10.5 K/uL 7.8  RBC 3.87 - 5.11 MIL/uL 5.08  Hemoglobin 12.0 - 15.0 g/dL 15.8 (H)  HCT 36.0 - 46.0 % 45.2  MCV 80.0 - 100.0 fL 89.0  MCH  26.0 - 34.0 pg 31.1  MCHC 30.0 - 36.0 g/dL 35.0  RDW 11.5 - 15.5 % 12.9  Platelets 150 - 400 K/uL 175  nRBC 0.0 - 0.2 % 0.0   Old records , labs and images have been reviewed.   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 -Patient endorses severe needle phobia that is not preventing  him from receiving testosterone injections, I have offered to switch to topical testosterone but the patient would like to remain on testosterone injections -Testosterone levels remain low, I will increase the dose as below as well as change the formulation of testosterone -Estradiol levels acceptable -We discussed side effects of testosterone therapy such as erythropoiesis, and thromboembolic risk    Medications   Testosterone cypionate 200 mg/ML, take 100 mg (0.5 mL) for 14 days   Follow-up in 6 months   Signed electronically by: Lyndle Herrlich, MD  [facility]  [facility] [address] [place] [place], [place] [address] Phone: [phone] FAX: [phone]      CC: Pcp, No No address on file Phone: None  Fax: None   Return to Endocrinology clinic as below: No future appointments.

## 2022-07-19 ENCOUNTER — Other Ambulatory Visit (HOSPITAL_COMMUNITY): Payer: Self-pay

## 2022-07-19 ENCOUNTER — Other Ambulatory Visit: Payer: Self-pay | Admitting: Internal Medicine

## 2022-07-19 ENCOUNTER — Telehealth (HOSPITAL_COMMUNITY): Payer: Self-pay

## 2022-07-19 LAB — ESTRADIOL: Estradiol: 32 pg/mL

## 2022-07-19 MED ORDER — TESTOSTERONE CYPIONATE 200 MG/ML IJ SOLN
100.0000 mg | INTRAMUSCULAR | 5 refills | Status: AC
Start: 2022-07-19 — End: ?

## 2022-07-19 NOTE — Telephone Encounter (Signed)
Received notification from [facility] regarding a prior authorization for Testosterone Cypionate 200mg /ml.    Authorization has been submitted via CMM to [employer]. Authorization is pending.     KeyMargarette Asal - PA Case ID: [id number]

## 2022-07-20 ENCOUNTER — Other Ambulatory Visit (HOSPITAL_COMMUNITY): Payer: Self-pay

## 2022-07-20 NOTE — Telephone Encounter (Signed)
Received notification from [employer] regarding a prior authorization for Testosterone Cypionate 200mg /ml.   Authorization has been APPROVED from 07/20/2022 to 07/19/2025.   Per test claim, copay for 28 days supply is $15.00  Key: [id number] PA Case ID: [id number]   Approval letter scanned to chart.

## 2022-08-04 ENCOUNTER — Encounter: Payer: Self-pay | Admitting: Internal Medicine

## 2022-08-19 ENCOUNTER — Telehealth: Payer: 59 | Admitting: Physician Assistant

## 2022-08-19 DIAGNOSIS — R42 Dizziness and giddiness: Secondary | ICD-10-CM | POA: Diagnosis not present

## 2022-08-19 MED ORDER — MECLIZINE HCL 25 MG PO TABS: 25.0000 mg | ORAL_TABLET | Freq: Three times a day (TID) | ORAL | 0 refills | Status: DC | PRN

## 2022-08-19 NOTE — Progress Notes (Signed)
E Visit for Motion Sickness  We are sorry that you are not feeling well. Here is how we plan to help!  Based on what you have shared with me it looks like you have symptoms of motion sickness.  I have prescribed a medication that will help prevent or alleviate your symptoms:  Meclizine 25mg by mouth three times per day as needed for nausea/motion sickness   Prevention:  You might feel better if you keep your eyes focused on outside while you are in motion. For example, if you are in a car, sit in the front and look in the direction you are moving; if you are on a boat, stay on the deck and look to the horizon. This helps make what you see match the movement you are feeling, and so you are less likely to feel sick.  You should also avoid reading, watching a movie, texting or reading messages, or looking at things close to you inside the vehicle you are riding in.  Use the seat head rest. Lean your head against the back of the seat or head rest when traveling in vehicles with seats to minimize head movements.  On a ship: When making your reservations, choose a cabin in the middle of the ship and near the waterline. When on board, go up on deck and focus on the horizon.  In an airplane: Request a window seat and look out the window. A seat over the front edge of the wing is the most preferable spot (the degree of motion is the lowest here). Direct the air vent to blow cool air on your face.  On a train: Always face forward and sit near a window.  In a vehicle: Sit in the front seat; if you are the passenger, look at the scenery in the distance. For some people, driving the vehicle (rather than being a passenger) is an instant remedy.  Avoid others who have become nauseous with motion sickness. Seeing and smelling others who have motion sickness may cause you to become sick.  GET HELP RIGHT AWAY IF:  Your symptoms do not improve or worsen within 2 days after treatment.  You cannot keep  down fluids after trying the medication.  Other associated symptoms such as severe headache, visual field changes, fever, or intractable nausea and vomiting.  MAKE SURE YOU:  Understand these instructions. Will watch your condition. Will get help right away if you are not doing well or get worse.  Thank you for choosing an e-visit.  Your e-visit answers were reviewed by a board certified advanced clinical practitioner to complete your personal care plan. Depending upon the condition, your plan could have included both over the counter or prescription medications.  Please review your pharmacy choice. Be sure that the pharmacy you have chosen is open so that you can pick up your prescription now.  If there is a problem you may message your provider in MyChart to have the prescription routed to another pharmacy.  Your safety is important to us. If you have drug allergies check your prescription carefully.   For the next 24 hours, you can use MyChart to ask questions about today's visit, request a non-urgent call back, or ask for a work or school excuse from your e-visit provider.  You will get an e-mail in the next two days asking about your experience. I hope that your e-visit has been valuable and will speed your recovery.   References or for more information: https://wwwnc.cdc.gov/travel/yellowbook/2020/travel-by-air-land-sea/motion-sickness https://my.[facility].org/health/articles/12782-motion-sickness https://www.uptodate.com    I have spent 5 minutes in review of e-visit questionnaire, review and updating patient chart, medical decision making and response to patient.   Farhana Fellows M Justino Boze, PA-C  

## 2022-09-06 ENCOUNTER — Encounter: Payer: Self-pay | Admitting: Internal Medicine

## 2022-09-16 ENCOUNTER — Telehealth: Payer: Self-pay | Admitting: Physician Assistant

## 2022-09-16 DIAGNOSIS — K047 Periapical abscess without sinus: Secondary | ICD-10-CM

## 2022-09-16 MED ORDER — PENICILLIN V POTASSIUM 500 MG PO TABS: 500.0000 mg | ORAL_TABLET | Freq: Three times a day (TID) | ORAL | 0 refills | Status: AC

## 2022-09-16 MED ORDER — IBUPROFEN 600 MG PO TABS: 600.0000 mg | ORAL_TABLET | Freq: Three times a day (TID) | ORAL | 0 refills | Status: DC | PRN

## 2022-09-16 NOTE — Progress Notes (Signed)
E-Visit for Dental Pain  We are sorry that you are not feeling well.  Here is how we plan to help!  Based on what you have shared with me in the questionnaire, it sounds like you have a possible dental infection; it is possible it could be TMJ dysfunction instead  Pen VK 500mg  3 times a day for 7 days and Ibuprofen 600mg  3 times a day for 7 days for discomfort  It is imperative that you see a dentist within 10 days of this eVisit to determine the cause of the dental pain and be sure it is adequately treated  A toothache or tooth pain is caused when the nerve in the root of a tooth or surrounding a tooth is irritated. Dental (tooth) infection, decay, injury, or loss of a tooth are the most common causes of dental pain. Pain may also occur after an extraction (tooth is pulled out). Pain sometimes originates from other areas and radiates to the jaw, thus appearing to be tooth pain.Bacteria growing inside your mouth can contribute to gum disease and dental decay, both of which can cause pain. A toothache occurs from inflammation of the central portion of the tooth called pulp. The pulp contains nerve endings that are very sensitive to pain. Inflammation to the pulp or pulpitis may be caused by dental cavities, trauma, and infection.    HOME CARE:   For toothaches: Over-the-counter pain medications such as acetaminophen or ibuprofen may be used. Take these as directed on the package while you arrange for a dental appointment. Avoid very cold or hot foods, because they may make the pain worse. You may get relief from biting on a cotton ball soaked in oil of cloves. You can get oil of cloves at most drug stores.  For jaw pain:  Aspirin may be helpful for problems in the joint of the jaw in adults. If pain happens every time you open your mouth widely, the temporomandibular joint (TMJ) may be the source of the pain. Yawning or taking a large bite of food may worsen the pain. An appointment with your  doctor or dentist will help you find the cause.     GET HELP RIGHT AWAY IF:  You have a high fever or chills If you have had a recent head or face injury and develop headache, light headedness, nausea, vomiting, or other symptoms that concern you after an injury to your face or mouth, you could have a more serious injury in addition to your dental injury. A facial rash associated with a toothache: This condition may improve with medication. Contact your doctor for them to decide what is appropriate. Any jaw pain occurring with chest pain: Although jaw pain is most commonly caused by dental disease, it is sometimes referred pain from other areas. People with heart disease, especially people who have had stents placed, people with diabetes, or those who have had heart surgery may have jaw pain as a symptom of heart attack or angina. If your jaw or tooth pain is associated with lightheadedness, sweating, or shortness of breath, you should see a doctor as soon as possible. Trouble swallowing or excessive pain or bleeding from gums: If you have a history of a weakened immune system, diabetes, or steroid use, you may be more susceptible to infections. Infections can often be more severe and extensive or caused by unusual organisms. Dental and gum infections in people with these conditions may require more aggressive treatment. An abscess may need draining or IV  antibiotics, for example.  MAKE SURE YOU   Understand these instructions. Will watch your condition. Will get help right away if you are not doing well or get worse.  Thank you for choosing an e-visit.  Your e-visit answers were reviewed by a board certified advanced clinical practitioner to complete your personal care plan. Depending upon the condition, your plan could have included both over the counter or prescription medications.  Please review your pharmacy choice. Make sure the pharmacy is open so you can pick up prescription now. If  there is a problem, you may contact your provider through [employer] and have the prescription routed to another pharmacy.  Your safety is important to [place]. If you have drug allergies check your prescription carefully.   For the next 24 hours you can use MyChart to ask questions about today's visit, request a non-urgent call back, or ask for a work or school excuse. You will get an email in the next two days asking about your experience. I hope that your e-visit has been valuable and will speed your recovery.  I have spent 5 minutes in review of e-visit questionnaire, review and updating patient chart, medical decision making and response to patient.   Margaretann Loveless, PA-C

## 2022-11-16 ENCOUNTER — Telehealth: Payer: Self-pay | Admitting: Nurse Practitioner

## 2022-11-16 DIAGNOSIS — R051 Acute cough: Secondary | ICD-10-CM

## 2022-11-16 DIAGNOSIS — J069 Acute upper respiratory infection, unspecified: Secondary | ICD-10-CM

## 2022-11-16 MED ORDER — BENZONATATE 100 MG PO CAPS: 100.0000 mg | ORAL_CAPSULE | Freq: Three times a day (TID) | ORAL | 0 refills | Status: DC | PRN

## 2022-11-16 MED ORDER — FLUTICASONE PROPIONATE 50 MCG/ACT NA SUSP: 2.0000 | Freq: Every day | NASAL | 6 refills | Status: DC

## 2022-11-16 MED ORDER — ALBUTEROL SULFATE HFA 108 (90 BASE) MCG/ACT IN AERS: 2.0000 | INHALATION_SPRAY | Freq: Four times a day (QID) | RESPIRATORY_TRACT | 0 refills | Status: AC | PRN

## 2022-11-16 NOTE — Progress Notes (Signed)
E-Visit for Upper Respiratory Infection   We are sorry you are not feeling well.  Here is how we plan to help! If your chest pain is present when you are not coughing, or is a pain that is not related to cough we recommend follow up today at urgent care or ER.  Based on what you have shared with me, it looks like you may have a viral upper respiratory infection.  Upper respiratory infections are caused by a large number of viruses; however, rhinovirus is the most common cause.   Symptoms vary from person to person, with common symptoms including sore throat, cough, fatigue or lack of energy and feeling of general discomfort.  A low-grade fever of up to 100.4 may present, but is often uncommon.  Symptoms vary however, and are closely related to a person's age or underlying illnesses.  The most common symptoms associated with an upper respiratory infection are nasal discharge or congestion, cough, sneezing, headache and pressure in the ears and face.  These symptoms usually persist for about 3 to 10 days, but can last up to 2 weeks.  It is important to know that upper respiratory infections do not cause serious illness or complications in most cases.    Upper respiratory infections can be transmitted from person to person, with the most common method of transmission being a person's hands.  The virus is able to live on the skin and can infect other persons for up to 2 hours after direct contact.  Also, these can be transmitted when someone coughs or sneezes; thus, it is important to cover the mouth to reduce this risk.  To keep the spread of the illness at [place], good hand hygiene is very important.  This is an infection that is most likely caused by a virus. There are no specific treatments other than to help you with the symptoms until the infection runs its course.  We are sorry you are not feeling well.  Here is how we plan to help!   For nasal congestion, you may use an oral decongestants such as  Mucinex D or if you have glaucoma or high blood pressure use plain Mucinex.  Saline nasal spray or nasal drops can help and can safely be used as often as needed for congestion.  For your congestion, I have prescribed Fluticasone nasal spray one spray in each nostril twice a day  If you do not have a history of heart disease, hypertension, diabetes or thyroid disease, prostate/bladder issues or glaucoma, you may also use Sudafed to treat nasal congestion.  It is highly recommended that you consult with a pharmacist or your primary care physician to ensure this medication is safe for you to take.     If you have a cough, you may use cough suppressants such as Delsym and Robitussin.  If you have glaucoma or high blood pressure, you can also use Coricidin HBP.   For cough I have prescribed for you A prescription cough medication called Tessalon Perles 100 mg. You may take 1-2 capsules every 8 hours as needed for cough We will also send in an inhaler prescription to help with your cough.   Meds ordered this encounter  Medications   benzonatate (TESSALON) 100 MG capsule    Sig: Take 1 capsule (100 mg total) by mouth 3 (three) times daily as needed.    Dispense:  30 capsule    Refill:  0   fluticasone (FLONASE) 50 MCG/ACT nasal spray  Sig: Place 2 sprays into both nostrils daily.    Dispense:  16 g    Refill:  6   albuterol (VENTOLIN HFA) 108 (90 Base) MCG/ACT inhaler    Sig: Inhale 2 puffs into the lungs every 6 (six) hours as needed for wheezing or shortness of breath.    Dispense:  8 g    Refill:  0     If you have a sore or scratchy throat, use a saltwater gargle-  to  teaspoon of salt dissolved in a 4-ounce to 8-ounce glass of warm water.  Gargle the solution for approximately 15-30 seconds and then spit.  It is important not to swallow the solution.  You can also use throat lozenges/cough drops and Chloraseptic spray to help with throat pain or discomfort.  Warm or cold liquids can also  be helpful in relieving throat pain.  For headache, pain or general discomfort, you can use Ibuprofen or Tylenol as directed.   Some authorities believe that zinc sprays or the use of Echinacea may shorten the course of your symptoms.   HOME CARE Only take medications as instructed by your medical team. Be sure to drink plenty of fluids. Water is fine as well as fruit juices, sodas and electrolyte beverages. You may want to stay away from caffeine or alcohol. If you are nauseated, try taking small sips of liquids. How do you know if you are getting enough fluid? Your urine should be a pale yellow or almost colorless. Get rest. Taking a steamy shower or using a humidifier may help nasal congestion and ease sore throat pain. You can place a towel over your head and breathe in the steam from hot water coming from a faucet. Using a saline nasal spray works much the same way. Cough drops, hard candies and sore throat lozenges may ease your cough. Avoid close contacts especially the very young and the elderly Cover your mouth if you cough or sneeze Always remember to wash your hands.   GET HELP RIGHT AWAY IF: You develop worsening fever. If your symptoms do not improve within 10 days You develop yellow or green discharge from your nose over 3 days. You have coughing fits You develop a severe head ache or visual changes. You develop shortness of breath, difficulty breathing or start having chest pain Your symptoms persist after you have completed your treatment plan  MAKE SURE YOU  Understand these instructions. Will watch your condition. Will get help right away if you are not doing well or get worse.  Thank you for choosing an e-visit.  Your e-visit answers were reviewed by a board certified advanced clinical practitioner to complete your personal care plan. Depending upon the condition, your plan could have included both over the counter or prescription medications.  Please review your  pharmacy choice. Make sure the pharmacy is open so you can pick up prescription now. If there is a problem, you may contact your provider through [employer] and have the prescription routed to another pharmacy.  Your safety is important to [place]. If you have drug allergies check your prescription carefully.   For the next 24 hours you can use MyChart to ask questions about today's visit, request a non-urgent call back, or ask for a work or school excuse. You will get an email in the next two days asking about your experience. I hope that your e-visit has been valuable and will speed your recovery.  I spent approximately 5 minutes reviewing the patient's  history, current symptoms and coordinating their care today.

## 2023-01-18 ENCOUNTER — Ambulatory Visit: Payer: 59 | Admitting: Internal Medicine

## 2023-06-02 ENCOUNTER — Telehealth: Payer: Self-pay | Admitting: Nurse Practitioner

## 2023-06-02 DIAGNOSIS — R1084 Generalized abdominal pain: Secondary | ICD-10-CM

## 2023-06-02 NOTE — Progress Notes (Signed)
Myles, I would suggest having an evaluation in person so that you can have your abdomen examined. At this point you may require imaging or lab work   I feel your condition warrants further evaluation and I recommend that you be seen in a face to face visit.   NOTE: There will be NO CHARGE for this eVisit   If you are having a true medical emergency please call 911.      For an urgent face to face visit, [facility] has eight urgent care centers for your convenience:   NEW!! [facility] at [facility] Get Driving Directions [phone] [address] [place], [address]    [facility] Center at [facility] Get Driving Directions [phone] [address] [place], [place] [address]   [facility] Urgent Care Center [facility]) Get Driving Directions [phone] [address] [place], [place] [address]  [facility]) Get Driving Directions [phone] [address] [place],  [place]  [address]  [facility] Ur[facility] Center [facility] - at [employer]  [phone] [address] [place],  [place] [address]   [facility] Health Urgent Care at [facility] Get Driving Directions [phone] [address] [place], [place] [address]   [facility] Health Urgent Care at [facility] Get Driving Directions  [phone] [address] [place], [place] [address]   [facility] Health Urgent Care at [facility] Directions [phone] [address], [address] [place], [place] [address]  Your MyChart E-visit questionnaire answers were reviewed by a board certified advanced clinical practitioner to complete your personal care plan based on your specific symptoms.  Thank you for using e-Visits.

## 2023-07-31 ENCOUNTER — Telehealth: Payer: Self-pay | Admitting: Physician Assistant

## 2023-07-31 DIAGNOSIS — R42 Dizziness and giddiness: Secondary | ICD-10-CM

## 2023-07-31 MED ORDER — MECLIZINE HCL 25 MG PO TABS
25.0000 mg | ORAL_TABLET | Freq: Three times a day (TID) | ORAL | 0 refills | Status: AC | PRN
Start: 2023-07-31 — End: ?

## 2023-07-31 NOTE — Progress Notes (Addendum)
E-Visit for Motion Sickness  We are sorry that you are not feeling well. Here is how we plan to help!  Based on what you have shared with me it looks like you have symptoms of motion sickness.  I have prescribed a medication that will help prevent or alleviate your symptoms:  Meclizine 25mg  by mouth three times per day as needed for nausea/motion sickness  For your headache I recommend Excedrin Migraine, rest, drink plenty of fluids, apply ice pack.  If there is no relief please make a Video Visit appointment or a visit in person at an Urgent Care.    Prevention:  You might feel better if you keep your eyes focused on outside while you are in motion. For example, if you are in a car, sit in the front and look in the direction you are moving; if you are on a boat, stay on the deck and look to the horizon. This helps make what you see match the movement you are feeling, and so you are less likely to feel sick.  You should also avoid reading, watching a movie, texting or reading messages, or looking at things close to you inside the vehicle you are riding in.  Use the seat head rest. Lean your head against the back of the seat or head rest when traveling in vehicles with seats to minimize head movements.  On a ship: When making your reservations, choose a cabin in the middle of the ship and near the waterline. When on board, go up on deck and focus on the horizon.  In an airplane: Request a window seat and look out the window. A seat over the front edge of the wing is the most preferable spot (the degree of motion is the lowest here). Direct the air vent to blow cool air on your face.  On a train: Always face forward and sit near a window.  In a vehicle: Sit in the front seat; if you are the passenger, look at the scenery in the distance. For some people, driving the vehicle (rather than being a passenger) is an instant remedy.  Avoid others who have become nauseous with motion sickness.  Seeing and smelling others who have motion sickness may cause you to become sick.  GET HELP RIGHT AWAY IF:  Your symptoms do not improve or worsen within 2 days after treatment.  You cannot keep down fluids after trying the medication.  Other associated symptoms such as severe headache, visual field changes, fever, or intractable nausea and vomiting.  MAKE SURE YOU:  Understand these instructions. Will watch your condition. Will get help right away if you are not doing well or get worse.  Thank you for choosing an e-visit.  Your e-visit answers were reviewed by a board certified advanced clinical practitioner to complete your personal care plan. Depending upon the condition, your plan could have included both over the counter or prescription medications.  Please review your pharmacy choice. Be sure that the pharmacy you have chosen is open so that you can pick up your prescription now.  If there is a problem you may message your provider in MyChart to have the prescription routed to another pharmacy.  Your safety is important to [place]. If you have drug allergies check your prescription carefully.   For the next 24 hours, you can use MyChart to ask questions about today's visit, request a non-urgent call back, or ask for a work or school excuse from your e-visit provider.  You will get an e-mail in the next two days asking about your experience. I hope that your e-visit has been valuable and will speed your recovery.   References or for more information: https://cross.com/ https://my.https://rowe.info/ https://www.uptodate.com   I have spent 5 minutes in review of e-visit questionnaire, review and updating patient chart, medical decision making and response to patient.   Tylene Fantasia Ward, PA-C

## 2023-08-03 ENCOUNTER — Telehealth: Payer: Self-pay | Admitting: Physician Assistant

## 2023-08-03 DIAGNOSIS — H66003 Acute suppurative otitis media without spontaneous rupture of ear drum, bilateral: Secondary | ICD-10-CM

## 2023-08-03 DIAGNOSIS — B9689 Other specified bacterial agents as the cause of diseases classified elsewhere: Secondary | ICD-10-CM

## 2023-08-03 DIAGNOSIS — J019 Acute sinusitis, unspecified: Secondary | ICD-10-CM

## 2023-08-03 MED ORDER — AMOXICILLIN-POT CLAVULANATE 875-125 MG PO TABS: 1.0000 | ORAL_TABLET | Freq: Two times a day (BID) | ORAL | 0 refills | Status: DC

## 2023-08-03 NOTE — Progress Notes (Signed)
I have spent 5 minutes in review of e-visit questionnaire, review and updating patient chart, medical decision making and response to patient.   Mia Milan Cody Jacklynn Dehaas, PA-C    

## 2023-08-03 NOTE — Progress Notes (Signed)
E-Visit for Sinus Problems  We are sorry that you are not feeling well.  Here is how we plan to help!  Based on what you have shared with me it looks like you have sinusitis and possible secondary ear infections.  Sinusitis is inflammation and infection in the sinus cavities of the head.  Based on your presentation I believe you most likely have Acute Bacterial Sinusitis.  This is an infection caused by bacteria and is treated with antibiotics. I have prescribed Augmentin 875mg /125mg  one tablet twice daily with food, for 7 days. You may use an oral decongestant such as Mucinex D or if you have glaucoma or high blood pressure use plain Mucinex. Saline nasal spray help and can safely be used as often as needed for congestion.  If you develop worsening sinus pain, fever or notice severe headache and vision changes, or if symptoms are not better after completion of antibiotic, please schedule an appointment with a health care provider.    Sinus infections are not as easily transmitted as other respiratory infection, however we still recommend that you avoid close contact with loved ones, especially the very young and elderly.  Remember to wash your hands thoroughly throughout the day as this is the number one way to prevent the spread of infection!  Home Care: Only take medications as instructed by your medical team. Complete the entire course of an antibiotic. Do not take these medications with alcohol. A steam or ultrasonic humidifier can help congestion.  You can place a towel over your head and breathe in the steam from hot water coming from a faucet. Avoid close contacts especially the very young and the elderly. Cover your mouth when you cough or sneeze. Always remember to wash your hands.  Get Help Right Away If: You develop worsening fever or sinus pain. You develop a severe head ache or visual changes. Your symptoms persist after you have completed your treatment plan.  Make sure  you Understand these instructions. Will watch your condition. Will get help right away if you are not doing well or get worse.  Thank you for choosing an e-visit.  Your e-visit answers were reviewed by a board certified advanced clinical practitioner to complete your personal care plan. Depending upon the condition, your plan could have included both over the counter or prescription medications.  Please review your pharmacy choice. Make sure the pharmacy is open so you can pick up prescription now. If there is a problem, you may contact your provider through [employer] and have the prescription routed to another pharmacy.  Your safety is important to [place]. If you have drug allergies check your prescription carefully.   For the next 24 hours you can use MyChart to ask questions about today's visit, request a non-urgent call back, or ask for a work or school excuse. You will get an email in the next two days asking about your experience. I hope that your e-visit has been valuable and will speed your recovery.

## 2023-08-06 ENCOUNTER — Telehealth: Payer: Self-pay | Admitting: Nurse Practitioner

## 2023-08-06 DIAGNOSIS — J019 Acute sinusitis, unspecified: Secondary | ICD-10-CM

## 2023-08-06 DIAGNOSIS — B9689 Other specified bacterial agents as the cause of diseases classified elsewhere: Secondary | ICD-10-CM

## 2023-08-06 MED ORDER — IBUPROFEN 800 MG PO TABS
800.0000 mg | ORAL_TABLET | Freq: Three times a day (TID) | ORAL | 0 refills | Status: AC | PRN
Start: 2023-08-06 — End: ?

## 2023-08-06 NOTE — Progress Notes (Signed)
E-Visit for Sinus Problems  We are sorry that you are not feeling well.  Here is how we plan to help!  I HAVE PRESCRIBED AN INCREASE DOSAGE OF IBUPROFEN AS REQUESTED.   You may use an oral decongestant such as Mucinex D or if you have glaucoma or high blood pressure use plain Mucinex. Saline nasal spray help and can safely be used as often as needed for congestion.  If you develop worsening sinus pain, fever or notice severe headache and vision changes, or if symptoms are not better after completion of antibiotic, please schedule an appointment with a health care provider.    Sinus infections are not as easily transmitted as other respiratory infection, however we still recommend that you avoid close contact with loved ones, especially the very young and elderly.  Remember to wash your hands thoroughly throughout the day as this is the number one way to prevent the spread of infection!  Home Care: Only take medications as instructed by your medical team. Complete the entire course of an antibiotic. Do not take these medications with alcohol. A steam or ultrasonic humidifier can help congestion.  You can place a towel over your head and breathe in the steam from hot water coming from a faucet. Avoid close contacts especially the very young and the elderly. Cover your mouth when you cough or sneeze. Always remember to wash your hands.  Get Help Right Away If: You develop worsening fever or sinus pain. You develop a severe head ache or visual changes. Your symptoms persist after you have completed your treatment plan.  Make sure you Understand these instructions. Will watch your condition. Will get help right away if you are not doing well or get worse.  Thank you for choosing an e-visit.  Your e-visit answers were reviewed by a board certified advanced clinical practitioner to complete your personal care plan. Depending upon the condition, your plan could have included both over the  counter or prescription medications.  Please review your pharmacy choice. Make sure the pharmacy is open so you can pick up prescription now. If there is a problem, you may contact your provider through [employer] and have the prescription routed to another pharmacy.  Your safety is important to [place]. If you have drug allergies check your prescription carefully.   For the next 24 hours you can use MyChart to ask questions about today's visit, request a non-urgent call back, or ask for a work or school excuse. You will get an email in the next two days asking about your experience. I hope that your e-visit has been valuable and will speed your recovery.

## 2023-08-06 NOTE — Progress Notes (Signed)
I have spent 5 minutes in review of e-visit questionnaire, review and updating patient chart, medical decision making and response to patient.  ° °Jerrell Mangel W Secilia Apps, NP ° °  °

## 2023-10-14 ENCOUNTER — Telehealth: Payer: Self-pay | Admitting: Nurse Practitioner

## 2023-10-14 DIAGNOSIS — R051 Acute cough: Secondary | ICD-10-CM

## 2023-10-14 DIAGNOSIS — J069 Acute upper respiratory infection, unspecified: Secondary | ICD-10-CM

## 2023-10-14 MED ORDER — FLUTICASONE PROPIONATE 50 MCG/ACT NA SUSP: 2.0000 | Freq: Every day | NASAL | 0 refills | Status: DC

## 2023-10-14 MED ORDER — BENZONATATE 100 MG PO CAPS: 100.0000 mg | ORAL_CAPSULE | Freq: Three times a day (TID) | ORAL | 0 refills | Status: DC | PRN

## 2023-10-14 NOTE — Progress Notes (Signed)
E-Visit for Upper Respiratory Infection   We are sorry you are not feeling well.  Here is how we plan to help!  Providers prescribe antibiotics to treat infections caused by bacteria. Antibiotics are very powerful in treating bacterial infections when they are used properly. To maintain their effectiveness, they should be used only when necessary. Overuse of antibiotics has resulted in the development of superbugs that are resistant to treatment!    After careful review of your answers, I would not recommend an antibiotic for your condition.  Antibiotics are not effective against viruses and therefore should not be used to treat them. Common examples of infections caused by viruses include colds and flu   Based on what you have shared with me, it looks like you may have a viral upper respiratory infection.  Upper respiratory infections are caused by a large number of viruses; however, rhinovirus is the most common cause.   Symptoms vary from person to person, with common symptoms including sore throat, cough, fatigue or lack of energy and feeling of general discomfort.  A low-grade fever of up to 100.4 may present, but is often uncommon.  Symptoms vary however, and are closely related to a person's age or underlying illnesses.  The most common symptoms associated with an upper respiratory infection are nasal discharge or congestion, cough, sneezing, headache and pressure in the ears and face.  These symptoms usually persist for about 3 to 10 days, but can last up to 2 weeks.  It is important to know that upper respiratory infections do not cause serious illness or complications in most cases.    Upper respiratory infections can be transmitted from person to person, with the most common method of transmission being a person's hands.  The virus is able to live on the skin and can infect other persons for up to 2 hours after direct contact.  Also, these can be transmitted when someone coughs or sneezes;  thus, it is important to cover the mouth to reduce this risk.  To keep the spread of the illness at bay, good hand hygiene is very important.  This is an infection that is most likely caused by a virus. There are no specific treatments other than to help you with the symptoms until the infection runs its course.  We are sorry you are not feeling well.  Here is how we plan to help!   For nasal congestion, you may use an oral decongestants such as Mucinex D or if you have glaucoma or high blood pressure use plain Mucinex.  Saline nasal spray or nasal drops can help and can safely be used as often as needed for congestion.  For your congestion, I recommend you use the flonase nasal spray:  If you do not have a history of heart disease, hypertension, diabetes or thyroid disease, prostate/bladder issues or glaucoma, you may also use Sudafed to treat nasal congestion.  It is highly recommended that you consult with a pharmacist or your primary care physician to ensure this medication is safe for you to take.     If you have a cough, you may use cough suppressants such as Delsym and Robitussin.  If you have glaucoma or high blood pressure, you can also use Coricidin HBP.   For cough I have prescribed for you A prescription cough medication called Tessalon Perles 100 mg. You may take 1-2 capsules every 8 hours as needed for cough  If you have a sore or scratchy throat, use a  saltwater gargle-  to  teaspoon of salt dissolved in a 4-ounce to 8-ounce glass of warm water.  Gargle the solution for approximately 15-30 seconds and then spit.  It is important not to swallow the solution.  You can also use throat lozenges/cough drops and Chloraseptic spray to help with throat pain or discomfort.  Warm or cold liquids can also be helpful in relieving throat pain.  For headache, pain or general discomfort, you can use Ibuprofen or Tylenol as directed.   Some authorities believe that zinc sprays or the use of Echinacea  may shorten the course of your symptoms.   HOME CARE Only take medications as instructed by your medical team. Be sure to drink plenty of fluids. Water is fine as well as fruit juices, sodas and electrolyte beverages. You may want to stay away from caffeine or alcohol. If you are nauseated, try taking small sips of liquids. How do you know if you are getting enough fluid? Your urine should be a pale yellow or almost colorless. Get rest. Taking a steamy shower or using a humidifier may help nasal congestion and ease sore throat pain. You can place a towel over your head and breathe in the steam from hot water coming from a faucet. Using a saline nasal spray works much the same way. Cough drops, hard candies and sore throat lozenges may ease your cough. Avoid close contacts especially the very young and the elderly Cover your mouth if you cough or sneeze Always remember to wash your hands.   GET HELP RIGHT AWAY IF: You develop worsening fever. If your symptoms do not improve within 10 days You develop yellow or green discharge from your nose over 3 days. You have coughing fits You develop a severe head ache or visual changes. You develop shortness of breath, difficulty breathing or start having chest pain Your symptoms persist after you have completed your treatment plan  MAKE SURE YOU  Understand these instructions. Will watch your condition. Will get help right away if you are not doing well or get worse.  Thank you for choosing an e-visit.  Your e-visit answers were reviewed by a board certified advanced clinical practitioner to complete your personal care plan. Depending upon the condition, your plan could have included both over the counter or prescription medications.  Please review your pharmacy choice. Make sure the pharmacy is open so you can pick up prescription now. If there is a problem, you may contact your provider through [employer] and have the prescription routed  to another pharmacy.  Your safety is important to [place]. If you have drug allergies check your prescription carefully.   For the next 24 hours you can use MyChart to ask questions about today's visit, request a non-urgent call back, or ask for a work or school excuse. You will get an email in the next two days asking about your experience. I hope that your e-visit has been valuable and will speed your recovery.

## 2023-10-14 NOTE — Progress Notes (Signed)
I have spent 5 minutes in review of e-visit questionnaire, review and updating patient chart, medical decision making and response to patient.  ° °Jerrell Mangel W Secilia Apps, NP ° °  °

## 2023-10-27 ENCOUNTER — Telehealth: Payer: Self-pay | Admitting: Family Medicine

## 2023-10-27 DIAGNOSIS — R051 Acute cough: Secondary | ICD-10-CM

## 2023-10-27 DIAGNOSIS — J069 Acute upper respiratory infection, unspecified: Secondary | ICD-10-CM

## 2023-10-27 MED ORDER — BENZONATATE 200 MG PO CAPS: 200.0000 mg | ORAL_CAPSULE | Freq: Two times a day (BID) | ORAL | 0 refills | Status: DC | PRN

## 2023-10-27 MED ORDER — FLUTICASONE PROPIONATE 50 MCG/ACT NA SUSP: 2.0000 | Freq: Every day | NASAL | 0 refills | Status: DC

## 2023-10-27 NOTE — Progress Notes (Signed)

## 2023-11-08 ENCOUNTER — Telehealth: Payer: Medicaid Other | Admitting: Family

## 2023-11-08 DIAGNOSIS — J069 Acute upper respiratory infection, unspecified: Secondary | ICD-10-CM | POA: Diagnosis not present

## 2023-11-08 MED ORDER — BENZONATATE 100 MG PO CAPS: 100.0000 mg | ORAL_CAPSULE | Freq: Three times a day (TID) | ORAL | 0 refills | Status: AC | PRN

## 2023-11-08 MED ORDER — FLUTICASONE PROPIONATE 50 MCG/ACT NA SUSP: 2.0000 | Freq: Every day | NASAL | 6 refills | Status: AC

## 2023-11-08 NOTE — Progress Notes (Signed)

## 2024-10-16 ENCOUNTER — Encounter
